# Patient Record
Sex: Male | Born: 1965 | Hispanic: No | Marital: Single | Smoking: Former smoker
Health system: Southern US, Community
[De-identification: ages and names within clinical notes are randomized; demographics above are authoritative.]

## PROBLEM LIST (undated history)

## (undated) DIAGNOSIS — J302 Other seasonal allergic rhinitis: Secondary | ICD-10-CM

## (undated) DIAGNOSIS — G2581 Restless legs syndrome: Secondary | ICD-10-CM

## (undated) DIAGNOSIS — G8929 Other chronic pain: Secondary | ICD-10-CM

## (undated) DIAGNOSIS — M549 Dorsalgia, unspecified: Secondary | ICD-10-CM

## (undated) HISTORY — DX: Restless legs syndrome: G25.81

## (undated) HISTORY — DX: Other seasonal allergic rhinitis: J30.2

## (undated) HISTORY — PX: APPENDECTOMY: SHX54

## (undated) HISTORY — PX: DENTAL SURGERY: SHX609

---

## 1998-04-23 ENCOUNTER — Ambulatory Visit (HOSPITAL_COMMUNITY): Admission: RE | Admit: 1998-04-23 | Discharge: 1998-04-23 | Payer: Self-pay | Admitting: Internal Medicine

## 1998-11-12 ENCOUNTER — Ambulatory Visit (HOSPITAL_COMMUNITY): Admission: RE | Admit: 1998-11-12 | Discharge: 1998-11-12 | Payer: Self-pay | Admitting: Internal Medicine

## 2010-07-15 ENCOUNTER — Encounter: Payer: Self-pay | Admitting: Orthopedic Surgery

## 2011-05-23 ENCOUNTER — Inpatient Hospital Stay (HOSPITAL_COMMUNITY)
Admission: EM | Admit: 2011-05-23 | Discharge: 2011-05-29 | DRG: 392 | Disposition: A | Discharge status: Home / Self Care | Payer: Managed Care, Other (non HMO) | Attending: Internal Medicine | Admitting: Internal Medicine

## 2011-05-23 ENCOUNTER — Encounter: Payer: Self-pay | Admitting: *Deleted

## 2011-05-23 DIAGNOSIS — M545 Low back pain, unspecified: Secondary | ICD-10-CM | POA: Diagnosis present

## 2011-05-23 DIAGNOSIS — R197 Diarrhea, unspecified: Secondary | ICD-10-CM | POA: Diagnosis present

## 2011-05-23 DIAGNOSIS — G2581 Restless legs syndrome: Secondary | ICD-10-CM | POA: Diagnosis present

## 2011-05-23 DIAGNOSIS — E871 Hypo-osmolality and hyponatremia: Secondary | ICD-10-CM | POA: Diagnosis present

## 2011-05-23 DIAGNOSIS — G8929 Other chronic pain: Secondary | ICD-10-CM | POA: Diagnosis present

## 2011-05-23 DIAGNOSIS — D72819 Decreased white blood cell count, unspecified: Secondary | ICD-10-CM | POA: Diagnosis present

## 2011-05-23 DIAGNOSIS — R110 Nausea: Secondary | ICD-10-CM | POA: Diagnosis not present

## 2011-05-23 DIAGNOSIS — A09 Infectious gastroenteritis and colitis, unspecified: Principal | ICD-10-CM | POA: Diagnosis present

## 2011-05-23 DIAGNOSIS — Z87891 Personal history of nicotine dependence: Secondary | ICD-10-CM

## 2011-05-23 DIAGNOSIS — E860 Dehydration: Secondary | ICD-10-CM | POA: Diagnosis present

## 2011-05-23 DIAGNOSIS — R21 Rash and other nonspecific skin eruption: Secondary | ICD-10-CM | POA: Diagnosis not present

## 2011-05-23 DIAGNOSIS — D6959 Other secondary thrombocytopenia: Secondary | ICD-10-CM | POA: Diagnosis present

## 2011-05-23 HISTORY — DX: Dorsalgia, unspecified: M54.9

## 2011-05-23 HISTORY — DX: Other chronic pain: G89.29

## 2011-05-23 LAB — DIFFERENTIAL
Basophils Relative: 0 % (ref 0–1)
Eosinophils Absolute: 0 10*3/uL (ref 0.0–0.7)
Lymphs Abs: 0.3 10*3/uL — ABNORMAL LOW (ref 0.7–4.0)
Monocytes Relative: 16 % — ABNORMAL HIGH (ref 3–12)
Neutro Abs: 1.3 10*3/uL — ABNORMAL LOW (ref 1.7–7.7)
Neutrophils Relative %: 70 % (ref 43–77)

## 2011-05-23 LAB — COMPREHENSIVE METABOLIC PANEL
ALT: 24 U/L (ref 0–53)
Albumin: 3.5 g/dL (ref 3.5–5.2)
Alkaline Phosphatase: 85 U/L (ref 39–117)
BUN: 9 mg/dL (ref 6–23)
Chloride: 90 mEq/L — ABNORMAL LOW (ref 96–112)
Glucose, Bld: 103 mg/dL — ABNORMAL HIGH (ref 70–99)
Potassium: 3.9 mEq/L (ref 3.5–5.1)
Sodium: 122 mEq/L — ABNORMAL LOW (ref 135–145)
Total Bilirubin: 0.4 mg/dL (ref 0.3–1.2)
Total Protein: 6.8 g/dL (ref 6.0–8.3)

## 2011-05-23 LAB — CBC
Hemoglobin: 12.8 g/dL — ABNORMAL LOW (ref 13.0–17.0)
MCH: 28.9 pg (ref 26.0–34.0)
Platelets: 163 10*3/uL (ref 150–400)
RBC: 4.43 MIL/uL (ref 4.22–5.81)

## 2011-05-23 MED ORDER — SODIUM CHLORIDE 0.9 % IV SOLN
20.0000 mL | INTRAVENOUS | Status: DC
  Administered 2011-05-23: 1000 mL via INTRAVENOUS
  Administered 2011-05-24: 05:00:00 via INTRAVENOUS

## 2011-05-23 NOTE — ED Provider Notes (Signed)
History     CSN: [id number] Arrival date & time: 05/23/2011 10:05 PM   First MD Initiated Contact with Patient 05/23/11 2302      Chief Complaint  Patient presents with  . Diarrhea    x2 days, [place] x 1 week ago, seen by PCP  . Fever  . Dizziness    hyponatremia  . Nausea     Patient is a 45 y.o. male presenting with diarrhea and fever.  Diarrhea  Fever Primary symptoms do not include shortness of breath.   This 63 old male presents with fever diarrhea, generalized fatigue. He notes returning from [place] 3 days ago. After a brief period of being well, he gradually developed his symptoms. Since onset he has felt weak, nauseous, anorexic, with diarrhea (6 times yesterday, multiple times today). Fever with maximum temperature 102, improves with Tylenol, but recurs. The patient has seen his physician twice in the illness. The patient's sodium level was abnormally low today, and he was referred to the ED for further evaluation.  Following this second presentation to his primary care physician, he was started on ciprofloxacin. The patient denies significant abdominal pain, chest pain, dyspnea, confusion, dysuria, ataxia, lower extremity edema.  Past Medical History  Diagnosis Date  . Chronic back pain greater than 3 months duration   . Asthma     Past Surgical History  Procedure Date  . Appendectomy   . Dental surgery     History reviewed. No pertinent family history.  History  Substance Use Topics  . Smoking status: Former Games developer  . Smokeless tobacco: Not on file  . Alcohol Use: Yes     occasionally      Review of Systems  HENT: Negative.   Eyes: Negative.   Respiratory: Negative for shortness of breath.   Cardiovascular: Negative.   Genitourinary: Negative.   Skin: Negative.   Neurological: Positive for light-headedness.  Hematological: Negative.   Psychiatric/Behavioral: Negative.     Allergies  Review of patient's allergies indicates no known  allergies.  Home Medications   Current Outpatient Rx  Name Route Sig Dispense Refill  . ACETAMINOPHEN 500 MG PO TABS Oral Take 500-1,000 mg by mouth every 6 (six) hours as needed. For pain.     . ALBUTEROL SULFATE HFA 108 (90 BASE) MCG/ACT IN AERS Inhalation Inhale 2 puffs into the lungs every 6 (six) hours as needed.      Marland Kitchen BISMUTH SUBSALICYLATE 262 MG PO CHEW Oral Chew 262 mg by mouth 3 (three) times daily as needed. For indigestion.     Marland Kitchen CIPROFLOXACIN HCL 500 MG PO TABS Oral Take 500 mg by mouth 2 (two) times daily. 3 day course of therapy, not completed.     . ETODOLAC 500 MG PO TABS Oral Take 500 mg by mouth as needed.      Marland Kitchen FLUTICASONE-SALMETEROL 100-50 MCG/DOSE IN AEPB Inhalation Inhale 1 puff into the lungs as needed.      Marland Kitchen GLUCOSAMINE HCL PO Oral Take 1 capsule by mouth daily.      Carma Leaven M PLUS PO TABS Oral Take 1 tablet by mouth daily.      Marland Kitchen ROPINIROLE HCL 0.25 MG PO TABS Oral Take 0.25 mg by mouth at bedtime.      Marland Kitchen VITAMIN C 250 MG PO TABS Oral Take 250 mg by mouth daily.        BP 100/59  Pulse 66  SpO2 100%  Physical Exam  Constitutional: He is oriented to person,  place, and time. He appears well-developed and well-nourished.  HENT:  Head: Normocephalic and atraumatic.  Eyes: Conjunctivae are normal. Pupils are equal, round, and reactive to light.  Neck: Neck supple.  Cardiovascular: Normal rate and regular rhythm.   Pulmonary/Chest: No respiratory distress.  Abdominal: Soft. There is no tenderness.  Musculoskeletal: He exhibits no edema.  Neurological: He is alert and oriented to person, place, and time.  Skin: Skin is warm and dry.  Psychiatric: He has a normal mood and affect.    ED Course  Procedures (including critical care time)   Labs Reviewed  CBC  DIFFERENTIAL  COMPREHENSIVE METABOLIC PANEL   No results found.   No diagnosis found.   45 year old male with days of diarrhea, nausea, fever following international travel. Likely traveler's  diarrhea, rule out significant lateral abnormalities. MDM  This previously well male presenting now for listlessness, diarrhea, nausea and fever is found to be not in distress on initial exam.  The patient's labs are notable for hyponatremia, evidence of dehydration consistent with persistent diarrhea. Given the patient's lack of abnormalities, will be admitted for rehydration, continued evaluation and management.        Gerhard Munch, MD 05/24/11 [id number]

## 2011-05-23 NOTE — ED Notes (Signed)
Pt c/o unrelenting diarrhea, has been seen by PCP, dx'd w/ hyponatremia since Wed. Sodium continues to decrease.

## 2011-05-24 ENCOUNTER — Encounter (HOSPITAL_COMMUNITY): Payer: Self-pay | Admitting: Internal Medicine

## 2011-05-24 DIAGNOSIS — R197 Diarrhea, unspecified: Secondary | ICD-10-CM | POA: Diagnosis present

## 2011-05-24 DIAGNOSIS — E860 Dehydration: Secondary | ICD-10-CM | POA: Diagnosis present

## 2011-05-24 DIAGNOSIS — E871 Hypo-osmolality and hyponatremia: Secondary | ICD-10-CM | POA: Diagnosis present

## 2011-05-24 LAB — CLOSTRIDIUM DIFFICILE BY PCR: Toxigenic C. Difficile by PCR: NEGATIVE

## 2011-05-24 LAB — BASIC METABOLIC PANEL
CO2: 27 mEq/L (ref 19–32)
Calcium: 7.8 mg/dL — ABNORMAL LOW (ref 8.4–10.5)
Chloride: 97 mEq/L (ref 96–112)
GFR calc Af Amer: 79 mL/min — ABNORMAL LOW (ref >90)
Sodium: 129 mEq/L — ABNORMAL LOW (ref 135–145)

## 2011-05-24 MED ORDER — FLUTICASONE-SALMETEROL 100-50 MCG/DOSE IN AEPB
1.0000 | INHALATION_SPRAY | Freq: Two times a day (BID) | RESPIRATORY_TRACT | Status: DC
  Filled 2011-05-24 (×2): qty 14

## 2011-05-24 MED ORDER — ROPINIROLE HCL 0.25 MG PO TABS
0.2500 mg | ORAL_TABLET | Freq: Every day | ORAL | Status: DC
  Administered 2011-05-26 (×2): 0.25 mg via ORAL
  Filled 2011-05-24 (×6): qty 1

## 2011-05-24 MED ORDER — THERA M PLUS PO TABS
1.0000 | ORAL_TABLET | Freq: Every day | ORAL | Status: DC
  Administered 2011-05-24 – 2011-05-29 (×6): 1 via ORAL
  Filled 2011-05-24 (×6): qty 1

## 2011-05-24 MED ORDER — SODIUM CHLORIDE 0.9 % IV SOLN
INTRAVENOUS | Status: AC
  Administered 2011-05-24 (×2): via INTRAVENOUS

## 2011-05-24 MED ORDER — CIPROFLOXACIN HCL 500 MG PO TABS
500.0000 mg | ORAL_TABLET | Freq: Two times a day (BID) | ORAL | Status: DC
  Filled 2011-05-24 (×4): qty 1

## 2011-05-24 MED ORDER — INFLUENZA VIRUS VACC SPLIT PF IM SUSP
0.5000 mL | INTRAMUSCULAR | Status: AC
  Filled 2011-05-24: qty 0.5

## 2011-05-24 MED ORDER — SODIUM CHLORIDE 0.9 % IV SOLN
INTRAVENOUS | Status: DC
  Administered 2011-05-24 – 2011-05-29 (×7): via INTRAVENOUS

## 2011-05-24 MED ORDER — ALBUTEROL SULFATE HFA 108 (90 BASE) MCG/ACT IN AERS: 2.0000 | INHALATION_SPRAY | RESPIRATORY_TRACT | Status: DC | PRN

## 2011-05-24 MED ORDER — ACETAMINOPHEN 500 MG PO TABS
500.0000 mg | ORAL_TABLET | ORAL | Status: DC | PRN
  Administered 2011-05-25: 1000 mg via ORAL
  Filled 2011-05-24: qty 2

## 2011-05-24 MED ORDER — KCL IN DEXTROSE-NACL 40-5-0.9 MEQ/L-%-% IV SOLN
INTRAVENOUS | Status: DC
  Administered 2011-05-24: 08:00:00 via INTRAVENOUS
  Filled 2011-05-24 (×10): qty 1000

## 2011-05-24 NOTE — ED Notes (Signed)
Patient denies pain and is resting comfortably.  

## 2011-05-24 NOTE — Progress Notes (Signed)
I have seen interviewed and examined patient admitted with diarrhea following a trip to [place], and found to be hyponatremic in the ED with borderline blood pressures. He is alert and mentating well, , and no further diarrhea so far this morning.We'll continue current management and plan as per Dr. Houston Siren, follow recheck of sodium and further manage as appropriate.

## 2011-05-24 NOTE — ED Notes (Signed)
Pt's wife:  Meshach Perry  Cell= [phone] [phone]

## 2011-05-24 NOTE — ED Notes (Signed)
Patient is resting comfortably. 

## 2011-05-24 NOTE — H&P (Signed)
PCP:   No primary provider on file.   Chief Complaint: Persistent diarrhea, hyponatremia.   HPI: [name] Ward is an 45 y.o. male with history of chronic lower back pain, restless leg syndrome, allergies, refer to the emergency room from his primary care physician for hyponatremia and persistent watery diarrhea. He has came back to the [place] from a trip to [place]. He was well and [place], but 2 days upon returning to the [place], he developed watery diarrhea. He denied any bloody stool abdominal pain, fever, chills, nausea, vomiting, lightheadedness, or any other symptomology. At his primary care physician, he was found to have a serum sodium of 118. He was subsequently referred here for dehydration, hyponatremia, and persistent diarrhea. No one in his family was ill. He did not eat any exotic food except for some curry. He has not been on antibiotics recently. He was just put on Cipro for his diarrhea.  Rewiew of Systems:  The patient denies anorexia, fever, weight loss,, vision loss, decreased hearing, hoarseness, chest pain, syncope, dyspnea on exertion, peripheral edema, balance deficits, hemoptysis, abdominal pain, melena, hematochezia, severe indigestion/heartburn, hematuria, incontinence, genital sores, muscle weakness, suspicious skin lesions, transient blindness, difficulty walking, depression, unusual weight change, abnormal bleeding, enlarged lymph nodes, angioedema, and breast masses.   Past Medical History  Diagnosis Date  . Chronic back pain greater than 3 months duration   . Asthma     Past Surgical History  Procedure Date  . Appendectomy   . Dental surgery     Medications:  HOME MEDS: Prior to Admission medications   Medication Sig Start Date End Date Taking? Authorizing Provider  acetaminophen (TYLENOL) 500 MG tablet Take 500-1,000 mg by mouth every 6 (six) hours as needed. For pain.    Yes Historical Provider, MD  albuterol (PROVENTIL HFA;VENTOLIN HFA) 108 (90 BASE)  MCG/ACT inhaler Inhale 2 puffs into the lungs every 6 (six) hours as needed.     Yes Historical Provider, MD  bismuth subsalicylate (PEPTO BISMOL) 262 MG chewable tablet Chew 262 mg by mouth 3 (three) times daily as needed. For indigestion.    Yes Historical Provider, MD  ciprofloxacin (CIPRO) 500 MG tablet Take 500 mg by mouth 2 (two) times daily. 3 day course of therapy, not completed.    Yes Historical Provider, MD  etodolac (LODINE) 500 MG tablet Take 500 mg by mouth as needed.     Yes Historical Provider, MD  Fluticasone-Salmeterol (ADVAIR) 100-50 MCG/DOSE AEPB Inhale 1 puff into the lungs as needed.     Yes Historical Provider, MD  GLUCOSAMINE HCL PO Take 1 capsule by mouth daily.     Yes Historical Provider, MD  Multiple Vitamins-Minerals (MULTIVITAMINS THER. W/MINERALS) TABS Take 1 tablet by mouth daily.     Yes Historical Provider, MD  rOPINIRole (REQUIP) 0.25 MG tablet Take 0.25 mg by mouth at bedtime.     Yes Historical Provider, MD  vitamin C (ASCORBIC ACID) 250 MG tablet Take 250 mg by mouth daily.     Yes Historical Provider, MD     Allergies:  No Known Allergies  Social History:   reports that he has quit smoking. He does not have any smokeless tobacco history on file. He reports that he drinks alcohol. He reports that he does not use illicit drugs.  Family History: History reviewed. No pertinent family history.   Physical Exam: Filed Vitals:   05/24/11 0030 05/24/11 0045 05/24/11 0100 05/24/11 0145  BP:   117/63 108/55  Pulse: 60  65    SpO2: 100% 100%     Blood pressure 108/55, pulse 65, SpO2 100.00%.  GEN:  Pleasant person lying in the stretcher in no acute distress; cooperative with exam PSYCH:  alert and oriented x4; does not appear anxious does not appear depressed; affect is normal HEENT: Mucous membranes pink and anicteric; PERRLA; EOM intact; no cervical lymphadenopathy nor thyromegaly or carotid bruit; no JVD; Breasts:: Not examined CHEST WALL: No  tenderness CHEST: Normal respiration, clear to auscultation bilaterally HEART: Regular rate and rhythm; no murmurs rubs or gallops BACK: No kyphosis or scoliosis; no CVA tenderness ABDOMEN: Obese, soft non-tender; no masses, no organomegaly, normal abdominal bowel sounds; no pannus; no intertriginous candida. Rectal Exam: Not done EXTREMITIES: No bone or joint deformity; age-appropriate arthropathy of the hands and knees; no edema; no ulcerations. Genitalia: not examined PULSES: 2+ and symmetric SKIN: Normal hydration no rash or ulceration. He does have poor skin turgor. CNS: Cranial nerves 2-12 grossly intact no focal neurologic deficit   Labs & Imaging Results for orders placed during the hospital encounter of 05/23/11 (from the past 48 hour(s))  CBC     Status: Abnormal   Collection Time   05/23/11 10:43 PM      Component Value Range Comment   WBC 1.9 (*) 4.0 - 10.5 (K/uL)    RBC 4.43  4.22 - 5.81 (MIL/uL)    Hemoglobin 12.8 (*) 13.0 - 17.0 (g/dL)    HCT 21.3 (*) 08.6 - 52.0 (%)    MCV 81.3  78.0 - 100.0 (fL)    MCH 28.9  26.0 - 34.0 (pg)    MCHC 35.6  30.0 - 36.0 (g/dL)    RDW 57.8  46.9 - 62.9 (%)    Platelets 163  150 - 400 (K/uL)   DIFFERENTIAL     Status: Abnormal   Collection Time   05/23/11 10:43 PM      Component Value Range Comment   Neutrophils Relative 70  43 - 77 (%)    Neutro Abs 1.3 (*) 1.7 - 7.7 (K/uL)    Lymphocytes Relative 15  12 - 46 (%)    Lymphs Abs 0.3 (*) 0.7 - 4.0 (K/uL)    Monocytes Relative 16 (*) 3 - 12 (%)    Monocytes Absolute 0.3  0.1 - 1.0 (K/uL)    Eosinophils Relative 0  0 - 5 (%)    Eosinophils Absolute 0.0  0.0 - 0.7 (K/uL)    Basophils Relative 0  0 - 1 (%)    Basophils Absolute 0.0  0.0 - 0.1 (K/uL)   COMPREHENSIVE METABOLIC PANEL     Status: Abnormal   Collection Time   05/23/11 10:43 PM      Component Value Range Comment   Sodium 122 (*) 135 - 145 (mEq/L)    Potassium 3.9  3.5 - 5.1 (mEq/L)    Chloride 90 (*) 96 - 112 (mEq/L)     CO2 25  19 - 32 (mEq/L)    Glucose, Bld 103 (*) 70 - 99 (mg/dL)    BUN 9  6 - 23 (mg/dL)    Creatinine, Ser 5.28  0.50 - 1.35 (mg/dL)    Calcium 8.2 (*) 8.4 - 10.5 (mg/dL)    Total Protein 6.8  6.0 - 8.3 (g/dL)    Albumin 3.5  3.5 - 5.2 (g/dL)    AST 44 (*) 0 - 37 (U/L)    ALT 24  0 - 53 (U/L)    Alkaline Phosphatase 85  39 - 117 (U/L)    Total Bilirubin 0.4  0.3 - 1.2 (mg/dL)    GFR calc non Af Amer 77 (*) >90 (mL/min)    GFR calc Af Amer 90 (*) >90 (mL/min)    No results found.    Assessment Present on Admission:  .Diarrhea .Hyponatremia .Dehydration Leukopenia  PLAN:  I suspect that he has infectious diarrhea. He is very volume depleted and having hyponatremia. Is not on any diuretic. Also noted that he has leukopenia. Will continue his Cipro by mouth. He would need fluid resuscitation with normal saline. We'll follow his serum sodium closely. We'll add potassium to his IV fluid. I will send stool for routine culture along with C. difficile PCR. Will also repeat a CBC tomorrow. He is stable and will be admitted to [facility].   Other plans as per orders.    Amit Leece 05/24/2011, 3:00 AM

## 2011-05-25 DIAGNOSIS — E871 Hypo-osmolality and hyponatremia: Secondary | ICD-10-CM

## 2011-05-25 LAB — CBC
HCT: 35.7 % — ABNORMAL LOW (ref 39.0–52.0)
MCH: 29.2 pg (ref 26.0–34.0)
MCHC: 35.3 g/dL (ref 30.0–36.0)
RDW: 12.6 % (ref 11.5–15.5)

## 2011-05-25 LAB — BASIC METABOLIC PANEL
BUN: 7 mg/dL (ref 6–23)
Calcium: 8.6 mg/dL (ref 8.4–10.5)
Creatinine, Ser: 1.09 mg/dL (ref 0.50–1.35)
GFR calc Af Amer: >90 mL/min (ref >90)
GFR calc non Af Amer: 79 mL/min — ABNORMAL LOW (ref >90)
Potassium: 4.2 mEq/L (ref 3.5–5.1)

## 2011-05-25 MED ORDER — CIPROFLOXACIN HCL 500 MG PO TABS
500.0000 mg | ORAL_TABLET | Freq: Two times a day (BID) | ORAL | Status: DC
  Administered 2011-05-25 – 2011-05-29 (×10): 500 mg via ORAL
  Filled 2011-05-25 (×12): qty 1

## 2011-05-25 MED ORDER — SIMETHICONE 80 MG PO CHEW
80.0000 mg | CHEWABLE_TABLET | Freq: Four times a day (QID) | ORAL | Status: DC | PRN
  Administered 2011-05-25: 80 mg via ORAL
  Filled 2011-05-25 (×2): qty 1

## 2011-05-25 NOTE — Progress Notes (Addendum)
Date of Admission:  05/23/2011  Date of Consult:  05/25/2011  Reason for Consult:fever after travel Referring Physician: Donna Bernard  Impression/Recommendation Fever Diarrhea Hyponatremia Leukopenia Would- check his serologies for Ameobiasis, giardiasis, cryptosporidiosos, HIV. Also check for noro and rota-viruses. If does not feel improved, has more fevers, loose BM, over the next 24-48 hours, would consider imaging his abdomen for occult abscess, and could consider sigmoidoscopy. He appears to be improving- he has been afebrile today. I explained to him that his decreased WBC could be a reaction to the systemic infection that he had.    Ward [name] is an 45 y.o. male.  HPI: 45 year old male with a history of asthma who returned on 05/18/2000 after spending one week in [place]. He felt well for the next 2 days however on the morning of the 28th he developed fever and stayed home from work. While at home, he had a presyncopal episode followed by fever and chills. He saw his physician that day and reportedly had a normal EKG but a sodium of 129. On November 29 he had 6 leg loose bowel movements stating that everything he ate essentially came out "raw". On November 30 he was seen by his physician again and was given a prescription for ciprofloxacin. He had blood work done showing a sodium of 119 and he was advised to come to the hospital. He had 3 loose bowel movements on November 30. Since coming to the hospital, he has had decreased loose bowel movements. he had one bowel movement on December 1 which he described as dark and slimy.  Past Medical History  Diagnosis Date  . Chronic back pain greater than 3 months duration   . Asthma     Past Surgical History  Procedure Date  . Appendectomy   . Dental surgery   ergies:   No Known Allergies  Medications: I have reviewed the patient's current medications.  Social History:  reports that he has quit smoking. He does not have any smokeless tobacco  history on file. He reports that he drinks alcohol. He reports that he does not use illicit drugs.  History reviewed. No pertinent family history.  ROS- please see history of present illness. He has had dizziness. He is a propelled weight loss during this period. He has had normal urination. He's had no headaches. He has had no oral ulcers. His major complaint is weakness. No lymphadenopathy.   Blood pressure 108/55, pulse 50, temperature 98.5 F (36.9 C), temperature source Oral, resp. rate 18, height 5\' 7"  (1.702 m), weight 69.2 kg (152 lb 8.9 oz), SpO2 100.00%. Physical exam: Eyes- EOMI, PERRL mouth shows no ulcers or thrush.  His neck is nontender, no lymphadenopathy. Chest is clear auscultation CV regular rhythm   Abdomen- bowel sounds positive, soft, nontender.  extremities show no cyanosis clubbing or edema. neurologic exam is grossly normal. Lymphatic- there is no cervical, supraclavicular or axillary adenopathy.   Results for orders placed during the hospital encounter of 05/23/11 (from the past 48 hour(s))  CBC     Status: Abnormal   Collection Time   05/23/11 10:43 PM      Component Value Range Comment   WBC 1.9 (*) 4.0 - 10.5 (K/uL)    RBC 4.43  4.22 - 5.81 (MIL/uL)    Hemoglobin 12.8 (*) 13.0 - 17.0 (g/dL)    HCT 16.1 (*) 09.6 - 52.0 (%)    MCV 81.3  78.0 - 100.0 (fL)    MCH 28.9  26.0 - 34.0 (  pg)    MCHC 35.6  30.0 - 36.0 (g/dL)    RDW 21.3  08.6 - 57.8 (%)    Platelets 163  150 - 400 (K/uL)   DIFFERENTIAL     Status: Abnormal   Collection Time   05/23/11 10:43 PM      Component Value Range Comment   Neutrophils Relative 70  43 - 77 (%)    Neutro Abs 1.3 (*) 1.7 - 7.7 (K/uL)    Lymphocytes Relative 15  12 - 46 (%)    Lymphs Abs 0.3 (*) 0.7 - 4.0 (K/uL)    Monocytes Relative 16 (*) 3 - 12 (%)    Monocytes Absolute 0.3  0.1 - 1.0 (K/uL)    Eosinophils Relative 0  0 - 5 (%)    Eosinophils Absolute 0.0  0.0 - 0.7 (K/uL)    Basophils Relative 0  0 - 1 (%)     Basophils Absolute 0.0  0.0 - 0.1 (K/uL)   COMPREHENSIVE METABOLIC PANEL     Status: Abnormal   Collection Time   05/23/11 10:43 PM      Component Value Range Comment   Sodium 122 (*) 135 - 145 (mEq/L)    Potassium 3.9  3.5 - 5.1 (mEq/L)    Chloride 90 (*) 96 - 112 (mEq/L)    CO2 25  19 - 32 (mEq/L)    Glucose, Bld 103 (*) 70 - 99 (mg/dL)    BUN 9  6 - 23 (mg/dL)    Creatinine, Ser 4.69  0.50 - 1.35 (mg/dL)    Calcium 8.2 (*) 8.4 - 10.5 (mg/dL)    Total Protein 6.8  6.0 - 8.3 (g/dL)    Albumin 3.5  3.5 - 5.2 (g/dL)    AST 44 (*) 0 - 37 (U/L)    ALT 24  0 - 53 (U/L)    Alkaline Phosphatase 85  39 - 117 (U/L)    Total Bilirubin 0.4  0.3 - 1.2 (mg/dL)    GFR calc non Af Amer 77 (*) >90 (mL/min)    GFR calc Af Amer 90 (*) >90 (mL/min)   BASIC METABOLIC PANEL     Status: Abnormal   Collection Time   05/24/11  2:10 PM      Component Value Range Comment   Sodium 129 (*) 135 - 145 (mEq/L)    Potassium 3.9  3.5 - 5.1 (mEq/L)    Chloride 97  96 - 112 (mEq/L)    CO2 27  19 - 32 (mEq/L)    Glucose, Bld 107 (*) 70 - 99 (mg/dL)    BUN 7  6 - 23 (mg/dL)    Creatinine, Ser 6.29  0.50 - 1.35 (mg/dL)    Calcium 7.8 (*) 8.4 - 10.5 (mg/dL)    GFR calc non Af Amer 68 (*) >90 (mL/min)    GFR calc Af Amer 79 (*) >90 (mL/min)   CLOSTRIDIUM DIFFICILE BY PCR     Status: Normal   Collection Time   05/24/11  3:07 PM      Component Value Range Comment   C difficile by pcr NEGATIVE  NEGATIVE    CBC     Status: Abnormal   Collection Time   05/25/11  6:17 AM      Component Value Range Comment   WBC 1.3 (*) 4.0 - 10.5 (K/uL)    RBC 4.32  4.22 - 5.81 (MIL/uL)    Hemoglobin 12.6 (*) 13.0 - 17.0 (g/dL)    HCT  35.7 (*) 39.0 - 52.0 (%)    MCV 82.6  78.0 - 100.0 (fL)    MCH 29.2  26.0 - 34.0 (pg)    MCHC 35.3  30.0 - 36.0 (g/dL)    RDW 91.4  78.2 - 95.6 (%)    Platelets 130 (*) 150 - 400 (K/uL)   BASIC METABOLIC PANEL     Status: Abnormal   Collection Time   05/25/11  6:17 AM      Component Value Range  Comment   Sodium 128 (*) 135 - 145 (mEq/L)    Potassium 4.2  3.5 - 5.1 (mEq/L)    Chloride 95 (*) 96 - 112 (mEq/L)    CO2 27  19 - 32 (mEq/L)    Glucose, Bld 103 (*) 70 - 99 (mg/dL)    BUN 7  6 - 23 (mg/dL)    Creatinine, Ser 2.13  0.50 - 1.35 (mg/dL)    Calcium 8.6  8.4 - 10.5 (mg/dL)    GFR calc non Af Amer 79 (*) >90 (mL/min)    GFR calc Af Amer >90  >90 (mL/min)    No results found for this basename: sdes, specrequest, cult, reptstatus   No results found.  Thank you so much for this interesting consult,   [name] Ward 05/25/2011, 5:28 PM

## 2011-05-25 NOTE — Progress Notes (Signed)
Pt would like Dr Ninetta Lights to consider a change or addition to his antibiotic. Pt reports no loose stools today but does report dark stool. Inquiring about Heme occult check. Pt does report taking Pepto Bismol for diarrhea pre admission. Appetite increasing and tolerating diet. No pain reported. Does complain of cold chills a few times today. Afebrile.

## 2011-05-25 NOTE — Progress Notes (Signed)
Subjective: States no further diarrhea today are, denies nausea vomiting but reports a decreased appetite.. Objective: Vital signs in last 24 hours: Temp:  [98.2 F (36.8 C)-98.5 F (36.9 C)] 98.5 F (36.9 C) (12/02 1418) Pulse Rate:  [50-60] 50  (12/02 1418) Resp:  [18] 18  (12/02 1418) BP: (98-124)/(53-73) 108/55 mmHg (12/02 1418) SpO2:  [100 %] 100 % (12/02 1418) Weight:  [69.2 kg (152 lb 8.9 oz)] 152 lb 8.9 oz (69.2 kg) (12/02 0723) Last BM Date: 05/24/11 Intake/Output from previous day: 12/01 0701 - 12/02 0700 In: 360 [P.O.:360] Out: -  Intake/Output this shift:      General Appearance:    Alert, cooperative, no distress, appears stated age  Lungs:     Clear to auscultation bilaterally, respirations unlabored   Heart:    Regular rate and rhythm, S1 and S2 normal, no murmur, rub   or gallop  Abdomen:     Soft, non-tender, bowel sounds active all four quadrants,    no masses, no organomegaly  Extremities:   Extremities normal, atraumatic, no cyanosis or edema  Neurologic:   CNII-XII intact, normal strength, sensation and reflexes    throughout    Weight change:  No intake or output data in the 24 hours ending 05/25/11 1457  Lab Results:   [facility] 05/25/11 0617 05/24/11 1410  NA 128* 129*  K 4.2 3.9  CL 95* 97  CO2 27 27  GLUCOSE 103* 107*  BUN 7 7  CREATININE 1.09 1.23  CALCIUM 8.6 7.8*    Basename 05/25/11 0617 05/23/11 2243  WBC 1.3* 1.9*  HGB 12.6* 12.8*  HCT 35.7* 36.0*  PLT 130* 163  MCV 82.6 81.3   PT/INR No results found for this basename: LABPROT:2,INR:2 in the last 72 hours ABG No results found for this basename: PHART:2,PCO2:2,PO2:2,HCO3:2 in the last 72 hours  Micro Results: Recent Results (from the past 240 hour(s))  CLOSTRIDIUM DIFFICILE BY PCR     Status: Normal   Collection Time   05/24/11  3:07 PM      Component Value Range Status Comment   C difficile by pcr NEGATIVE  NEGATIVE  Final    Studies/Results: No results  found. Medications:  Scheduled Meds:   . sodium chloride   Intravenous STAT  . ciprofloxacin  500 mg Oral BID  . Fluticasone-Salmeterol  1 puff Inhalation BID  . influenza  inactive virus vaccine  0.5 mL Intramuscular Tomorrow-1000  . multivitamins ther. w/minerals  1 tablet Oral Daily  . rOPINIRole  0.25 mg Oral QHS  . DISCONTD: ciprofloxacin  500 mg Oral BID   Continuous Infusions:   . sodium chloride 150 mL/hr at 05/25/11 0751  . dextrose 5 % and 0.9 % NaCl with KCl 40 mEq/L 125 mL/hr at 05/24/11 0758   PRN Meds:.acetaminophen, albuterol Assessment/Plan: Patient Active Hospital Problem List: Diarrhea - presumed infectious, C. difficile negative. Patient still febrile on Cipro, and leukopenic-I have consulted infectious disease, Dr. Ninetta Lights to see patient for further evaluation/recommendation.   Hyponatremia/Dehydration- improving with hydration, follow and recheck  Leukopenia- secondary to infection, follow recheck continue antibiotics as above pending ID recommendations.   LOS: 2 days   Jaqueline Uber C 05/25/2011, 2:57 PM

## 2011-05-26 DIAGNOSIS — R197 Diarrhea, unspecified: Secondary | ICD-10-CM

## 2011-05-26 LAB — COMPREHENSIVE METABOLIC PANEL
ALT: 66 U/L — ABNORMAL HIGH (ref 0–53)
AST: 94 U/L — ABNORMAL HIGH (ref 0–37)
Albumin: 3.9 g/dL (ref 3.5–5.2)
Alkaline Phosphatase: 89 U/L (ref 39–117)
Calcium: 9.4 mg/dL (ref 8.4–10.5)
Potassium: 4.1 mEq/L (ref 3.5–5.1)
Sodium: 129 mEq/L — ABNORMAL LOW (ref 135–145)
Total Protein: 7.5 g/dL (ref 6.0–8.3)

## 2011-05-26 LAB — CBC
Hemoglobin: 13.8 g/dL (ref 13.0–17.0)
MCH: 28.4 pg (ref 26.0–34.0)
MCHC: 34.8 g/dL (ref 30.0–36.0)
Platelets: 141 10*3/uL — ABNORMAL LOW (ref 150–400)
RDW: 12.3 % (ref 11.5–15.5)

## 2011-05-26 LAB — HIV ANTIBODY (ROUTINE TESTING W REFLEX): HIV: NONREACTIVE

## 2011-05-26 MED ORDER — INFLUENZA VIRUS VACC SPLIT PF IM SUSP
0.5000 mL | INTRAMUSCULAR | Status: AC
  Filled 2011-05-26: qty 0.5

## 2011-05-26 NOTE — Progress Notes (Signed)
Patient ID: [name] Ward, male   DOB: 1965-07-27, 45 y.o.   MRN: [id number] INFECTIOUS DISEASE PROGRESS NOTE   Subjective: [name] Ward is feeling better. His diarrhea has resolved. He had one semi-formed stool this morning. He is still having some problem with nausea but is tolerating liquids well without vomiting.  Day 3 total antibiotics Anti-infectives     Start     Dose/Rate Route Frequency Ordered Stop   05/25/11 0200   ciprofloxacin (CIPRO) tablet 500 mg        500 mg Oral 2 times daily 05/25/11 0022     05/24/11 1530   ciprofloxacin (CIPRO) tablet 500 mg  Status:  Discontinued        500 mg Oral 2 times daily 05/24/11 1418 05/25/11 0022          Objective Temp (24hrs), Avg:98.9 F (37.2 C), Min:98.2 F (36.8 C), Max:99.6 F (37.6 C)   He is in no distress and looks well. He has no rash and no palpable adenopathy. Lungs: Clear Cor: Regular S1 and S2 and no murmurs Abd: Soft and nontender, with quiet bowel sounds   Lab Results   Lab Results  Component Value Date   WBC 1.6* 05/26/2011   HGB 13.8 05/26/2011   HCT 39.7 05/26/2011   MCV 81.7 05/26/2011   PLT 141* 05/26/2011    Lab Results  Component Value Date   CREATININE 1.09 05/26/2011   BUN 6 05/26/2011   NA 129* 05/26/2011   K 4.1 05/26/2011   CL 91* 05/26/2011   CO2 29 05/26/2011    Lab Results  Component Value Date   ALT 66* 05/26/2011   AST 94* 05/26/2011   ALKPHOS 89 05/26/2011   BILITOT 0.4 05/26/2011     Microbiology: Recent Results (from the past 240 hour(s))  STOOL CULTURE     Status: Normal (Preliminary result)   Collection Time   05/24/11  9:09 AM      Component Value Range Status Comment   Specimen Description PERIRECTAL   Final    Special Requests NONE   Final    Culture NO SUSPICIOUS COLONIES, CONTINUING TO HOLD   Final    Report Status PENDING   Incomplete   CLOSTRIDIUM DIFFICILE BY PCR     Status: Normal   Collection Time   05/24/11  3:07 PM      Component Value Range Status Comment   C  difficile by pcr NEGATIVE  NEGATIVE  Final     Studies/Results: No results found.   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  Plan: 1. Continue ciprofloxacin pending final stool studies and serologies. If he continues to improve overnight he can probably be discharged home tomorrow to complete a seven-day course of antibiotic therapy.  [name] Ward [facility] [phone] 05/26/2011, 5:16 PM

## 2011-05-26 NOTE — Progress Notes (Signed)
Subjective: States no further diarrhea today are, state he had a mostly formed stool x1 greenish today. Still with poor appetite but eating and no vomiting. Objective: Vital signs in last 24 hours: Temp:  [98.2 F (36.8 C)-99.6 F (37.6 C)] 98.3 F (36.8 C) (12/03 1429) Pulse Rate:  [67-77] 74  (12/03 1429) Resp:  [18] 18  (12/03 1429) BP: (110-123)/(69-80) 116/80 mmHg (12/03 1429) SpO2:  [98 %-100 %] 100 % (12/03 1429) Weight:  [64.4 kg (141 lb 15.6 oz)] 141 lb 15.6 oz (64.4 kg) (12/03 0526) Last BM Date: 05/25/11 Intake/Output from previous day:   Intake/Output this shift: Total I/O In: 360 [P.O.:360] Out: -     General Appearance:    Alert, cooperative, no distress, appears stated age  Lungs:     Clear to auscultation bilaterally, respirations unlabored   Heart:    Regular rate and rhythm, S1 and S2 normal, no murmur, rub   or gallop  Abdomen:     Soft, non-tender, bowel sounds active all four quadrants,    no masses, no organomegaly  Extremities:   Extremities normal, atraumatic, no cyanosis or edema  Neurologic:   CNII-XII intact, normal strength,        Weight change: -1.108 kg (-2 lb 7.1 oz)  Intake/Output Summary (Last 24 hours) at 05/26/11 1649 Last data filed at 05/26/11 0757  Gross per 24 hour  Intake    360 ml  Output      0 ml  Net    360 ml    Lab Results:   [facility] 05/26/11 0515 05/25/11 0617  NA 129* 128*  K 4.1 4.2  CL 91* 95*  CO2 29 27  GLUCOSE 95 103*  BUN 6 7  CREATININE 1.09 1.09  CALCIUM 9.4 8.6    Basename 05/26/11 0515 05/25/11 0617  WBC 1.6* 1.3*  HGB 13.8 12.6*  HCT 39.7 35.7*  PLT 141* 130*  MCV 81.7 82.6   PT/INR No results found for this basename: LABPROT:2,INR:2 in the last 72 hours ABG No results found for this basename: PHART:2,PCO2:2,PO2:2,HCO3:2 in the last 72 hours  Micro Results: Recent Results (from the past 240 hour(s))  STOOL CULTURE     Status: Normal (Preliminary result)   Collection Time   05/24/11   9:09 AM      Component Value Range Status Comment   Specimen Description PERIRECTAL   Final    Special Requests NONE   Final    Culture NO SUSPICIOUS COLONIES, CONTINUING TO HOLD   Final    Report Status PENDING   Incomplete   CLOSTRIDIUM DIFFICILE BY PCR     Status: Normal   Collection Time   05/24/11  3:07 PM      Component Value Range Status Comment   C difficile by pcr NEGATIVE  NEGATIVE  Final    Studies/Results: No results found. Medications:  Scheduled Meds:    . ciprofloxacin  500 mg Oral BID  . Fluticasone-Salmeterol  1 puff Inhalation BID  . influenza  inactive virus vaccine  0.5 mL Intramuscular Tomorrow-1000  . influenza  inactive virus vaccine  0.5 mL Intramuscular Tomorrow-1000  . multivitamins ther. w/minerals  1 tablet Oral Daily  . rOPINIRole  0.25 mg Oral QHS   Continuous Infusions:    . sodium chloride 150 mL/hr at 05/26/11 1349  . dextrose 5 % and 0.9 % NaCl with KCl 40 mEq/L 125 mL/hr at 05/24/11 0758   PRN Meds:.acetaminophen, albuterol, simethicone Assessment/Plan: Patient Active Hospital Problem  List: Diarrhea - presumed infectious, C. difficile negative. Patient defervesced on Cipro, and diarrhea resolving. Appreciate infectious disease assistance, follow pending studies and await further ID recommendations.  Hyponatremia/Dehydration- no further significant change on continued IV fluids, will decrease fluids to [facility], check urine lytes and follow  Leukopenia- about the same, beginning to trend upward. secondary to infection, follow recheck continue antibiotics as above pending ID recommendations.   LOS: 3 days   Verdie Wilms C 05/26/2011, 4:49 PM

## 2011-05-27 LAB — SODIUM, URINE, RANDOM: Sodium, Ur: 93 mEq/L

## 2011-05-27 LAB — OSMOLALITY, URINE: Osmolality, Ur: 272 mOsm/kg — ABNORMAL LOW (ref 390–1090)

## 2011-05-27 LAB — CBC
Hemoglobin: 13.5 g/dL (ref 13.0–17.0)
MCH: 28.3 pg (ref 26.0–34.0)
MCHC: 35 g/dL (ref 30.0–36.0)
Platelets: 102 10*3/uL — ABNORMAL LOW (ref 150–400)
RBC: 4.77 MIL/uL (ref 4.22–5.81)

## 2011-05-27 LAB — BASIC METABOLIC PANEL
BUN: 10 mg/dL (ref 6–23)
CO2: 25 mEq/L (ref 19–32)
Calcium: 9 mg/dL (ref 8.4–10.5)
Chloride: 93 mEq/L — ABNORMAL LOW (ref 96–112)
GFR calc Af Amer: >90 mL/min (ref >90)
GFR calc non Af Amer: 86 mL/min — ABNORMAL LOW (ref >90)
GFR calc non Af Amer: >90 mL/min (ref >90)
Glucose, Bld: 98 mg/dL (ref 70–99)
Potassium: 4.4 mEq/L (ref 3.5–5.1)
Potassium: 4.1 mEq/L (ref 3.5–5.1)
Sodium: 124 mEq/L — ABNORMAL LOW (ref 135–145)
Sodium: 127 mEq/L — ABNORMAL LOW (ref 135–145)

## 2011-05-27 LAB — COMPREHENSIVE METABOLIC PANEL
Albumin: 3.5 g/dL (ref 3.5–5.2)
Alkaline Phosphatase: 89 U/L (ref 39–117)
BUN: 9 mg/dL (ref 6–23)
Calcium: 8.8 mg/dL (ref 8.4–10.5)
Creatinine, Ser: 0.96 mg/dL (ref 0.50–1.35)
GFR calc Af Amer: >90 mL/min (ref >90)
Glucose, Bld: 124 mg/dL — ABNORMAL HIGH (ref 70–99)
Total Protein: 6.9 g/dL (ref 6.0–8.3)

## 2011-05-27 LAB — STOOL CULTURE

## 2011-05-27 MED ORDER — ONDANSETRON HCL 4 MG/2ML IJ SOLN: 4.0000 mg | Freq: Four times a day (QID) | INTRAMUSCULAR | Status: DC | PRN

## 2011-05-27 NOTE — Progress Notes (Addendum)
Patient ID: [name] Ward, male   DOB: Nov 16, 1965, 45 y.o.   MRN: [id number] INFECTIOUS DISEASE PROGRESS NOTE ID: Camdin Hegner is a 45 y.o. male   Subjective:  He is feeling better. He has not had any further diarrhea. When I asked if he said nausea he says yes but I believe he may actually be describing anorexia more so than nausea. He has been able to eat some food at home and has not had any vomiting.  Day 4 total antibiotics Abtx:  Anti-infectives     Start     Dose/Rate Route Frequency Ordered Stop   05/25/11 0200   ciprofloxacin (CIPRO) tablet 500 mg        500 mg Oral 2 times daily 05/25/11 0022     05/24/11 1530   ciprofloxacin (CIPRO) tablet 500 mg  Status:  Discontinued        500 mg Oral 2 times daily 05/24/11 1418 05/25/11 0022          Objective Temp (24hrs), Avg:98.4 F (36.9 C), Min:97.8 F (36.6 C), Max:98.9 F (37.2 C)    He is alert and comfortable but worried. He has a very faint papular rash on his back compatible with heat rash. Lungs: Clear Cor: Regular S1 and S2 with no murmurs Abd: Soft with mild diffuse tenderness. Normal bowel sounds.  Lab Results   Lab Results  Component Value Date   WBC 1.8* 05/27/2011   HGB 13.5 05/27/2011   HCT 38.6* 05/27/2011   MCV 80.9 05/27/2011   PLT 102* 05/27/2011    Lab Results  Component Value Date   CREATININE 1.02 05/27/2011   BUN 7 05/27/2011   NA 124* 05/27/2011   K 4.4 05/27/2011   CL 89* 05/27/2011   CO2 25 05/27/2011    Lab Results  Component Value Date   ALT 66* 05/26/2011   AST 94* 05/26/2011   ALKPHOS 89 05/26/2011   BILITOT 0.4 05/26/2011     Microbiology: Recent Results (from the past 240 hour(s))  STOOL CULTURE     Status: Normal   Collection Time   05/24/11  9:09 AM      Component Value Range Status Comment   Specimen Description PERIRECTAL   Final    Special Requests NONE   Final    Culture     Final    Value: NO SALMONELLA, SHIGELLA, CAMPYLOBACTER, OR YERSINIA ISOLATED   Report Status  05/27/2011 FINAL   Final   CLOSTRIDIUM DIFFICILE BY PCR     Status: Normal   Collection Time   05/24/11  3:07 PM      Component Value Range Status Comment   C difficile by pcr NEGATIVE  NEGATIVE  Final     Studies/Results: No results found.   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 He continues to have some mild cytopenias, hyponatremia and mild hepatitis. I do not think the rash on his back is a significant component of this acute illness. It is probably due to heat rash from his sheets. I think it's much less likely the has an acute viral illness such as being the fever. He is quite nervous but overall I believe he is improving and I would simply continue oral ciprofloxacin for now.   Plan: 1. continue ciprofloxacin for 3 more days  2. monitor her CBC, electrolytes and liver enzymes  3. consider discharge home soon  Jonny Ruiz  [facility] for Infectious Diseases [phone] 05/27/2011, 2:24 PM

## 2011-05-27 NOTE — Progress Notes (Signed)
Subjective: Complaining of nausea, able to tolerate some food from home without vomiting.  Objective: Vital signs in last 24 hours: Temp:  [97.8 F (36.6 C)-98.9 F (37.2 C)] 98.5 F (36.9 C) (12/04 1435) Pulse Rate:  [54-69] 54  (12/04 1435) Resp:  [18] 18  (12/04 1435) BP: (94-122)/(53-75) 118/65 mmHg (12/04 1435) SpO2:  [98 %-100 %] 98 % (12/04 1435) Weight:  [67.586 kg (149 lb)-79.561 kg (175 lb 6.4 oz)] 175 lb 6.4 oz (79.561 kg) (12/04 0600) Last BM Date: 05/27/11 Intake/Output from previous day: 12/03 0701 - 12/04 0700 In: 860 [P.O.:360; I.V.:500] Out: 800 [Urine:800] Intake/Output this shift: Total I/O In: 240 [P.O.:240] Out: -     General Appearance:    Alert, cooperative, no distress, appears stated age  Lungs:     Clear to auscultation bilaterally, respirations unlabored   Heart:    Regular rate and rhythm, S1 and S2 normal, no murmur, rub   or gallop  Abdomen:     Soft, non-tender, bowel sounds active all four quadrants,    no masses, no organomegaly  Extremities:   Extremities normal, atraumatic, no cyanosis or edema  Neurologic:   CNII-XII intact, normal strength,        Weight change: -1.614 kg (-3 lb 8.9 oz)  Intake/Output Summary (Last 24 hours) at 05/27/11 1745 Last data filed at 05/27/11 0900  Gross per 24 hour  Intake    740 ml  Output    800 ml  Net    -60 ml    Lab Results:   [facility] 05/27/11 1557 05/27/11 0515  NA 127* 124*  K 4.1 4.4  CL 93* 89*  CO2 24 25  GLUCOSE 123* 98  BUN 10 7  CREATININE 0.92 1.02  CALCIUM 9.1 9.0    Basename 05/27/11 0515 05/26/11 0515  WBC 1.8* 1.6*  HGB 13.5 13.8  HCT 38.6* 39.7  PLT 102* 141*  MCV 80.9 81.7   PT/INR No results found for this basename: LABPROT:2,INR:2 in the last 72 hours ABG No results found for this basename: PHART:2,PCO2:2,PO2:2,HCO3:2 in the last 72 hours  Micro Results: Recent Results (from the past 240 hour(s))  STOOL CULTURE     Status: Normal   Collection Time   05/24/11  9:09 AM      Component Value Range Status Comment   Specimen Description PERIRECTAL   Final    Special Requests NONE   Final    Culture     Final    Value: NO SALMONELLA, SHIGELLA, CAMPYLOBACTER, OR YERSINIA ISOLATED   Report Status 05/27/2011 FINAL   Final   CLOSTRIDIUM DIFFICILE BY PCR     Status: Normal   Collection Time   05/24/11  3:07 PM      Component Value Range Status Comment   C difficile by pcr NEGATIVE  NEGATIVE  Final    Studies/Results: No results found. Medications:  Scheduled Meds:    . ciprofloxacin  500 mg Oral BID  . Fluticasone-Salmeterol  1 puff Inhalation BID  . influenza  inactive virus vaccine  0.5 mL Intramuscular Tomorrow-1000  . multivitamins ther. w/minerals  1 tablet Oral Daily  . rOPINIRole  0.25 mg Oral QHS   Continuous Infusions:    . sodium chloride 150 mL/hr at 05/27/11 1509  . DISCONTD: dextrose 5 % and 0.9 % NaCl with KCl 40 mEq/L 125 mL/hr at 05/24/11 0758   PRN Meds:.acetaminophen, albuterol, ondansetron (ZOFRAN) IV, simethicone Assessment/Plan: Patient Active Hospital Problem List: Diarrheal illness - presumed  infectious/?typhoid syndrome per ID. C. difficile negative. Patient remaining on afebrile on Cipro, and diarrhea resolving. ID recommending Cipro for 3 more days. Follow recheck CBC and  LFTs/Cmet in a.m. Hyponatremia/Dehydration- sodium dropped to 124 this a.m. after arm IV fluids were decreased, improved to 127 and after IV fluids and increased. Leukopenia- slowly trending up. secondary to infection, follow recheck continue antibiotics as per ID recommendations.   LOS: 4 days   [name] Ward C 05/27/2011, 5:45 PM

## 2011-05-28 LAB — CBC
HCT: 36.7 % — ABNORMAL LOW (ref 39.0–52.0)
Hemoglobin: 12.9 g/dL — ABNORMAL LOW (ref 13.0–17.0)
MCH: 28.5 pg (ref 26.0–34.0)
MCHC: 35.1 g/dL (ref 30.0–36.0)
RDW: 12.5 % (ref 11.5–15.5)

## 2011-05-28 MED ORDER — INFLUENZA VIRUS VACC SPLIT PF IM SUSP
0.5000 mL | INTRAMUSCULAR | Status: DC
  Filled 2011-05-28: qty 0.5

## 2011-05-28 NOTE — Progress Notes (Signed)
Subjective: Better. Had formed stool today, able to eat solid food, although somewhat "queasy". No new issues.  Objective: Vital signs in last 24 hours: Temp:  [97.4 F (36.3 C)-97.8 F (36.6 C)] 97.7 F (36.5 C) (12/05 1457) Pulse Rate:  [54-63] 63  (12/05 1457) Resp:  [17-18] 17  (12/05 1457) BP: (104-118)/(64-69) 104/69 mmHg (12/05 1457) SpO2:  [100 %] 100 % (12/05 1457) Weight change:  Last BM Date: 05/28/11  Intake/Output from previous day: 12/04 0701 - 12/05 0700 In: 2551.5 [P.O.:240; I.V.:2311.5] Out: -      Physical Exam:  General: Comfortable, alert, communicative, fully oriented, not short of breath at rest. Ambulating. HEENT:  No clinical pallor, no jaundice, no conjunctival injection or discharge. Hydration is fair. NECK:  Supple, JVP not seen, no carotid bruits, no palpable lymphadenopathy, no palpable goiter. CHEST:  Clinically clear to auscultation, no wheezes, no crackles. HEART:  Sounds 1 and 2 heard, normal, regular, no murmurs. ABDOMEN:  Full, soft,  non-tender, no palpable organomegaly, no palpable masses, normal bowel sounds. GENITALIA:  Not examined. LOWER EXTREMITIES:  No pitting edema, palpable peripheral pulses. MUSCULOSKELETAL SYSTEM:  Generalized osteoarthritic changes, otherwise, normal. CENTRAL NERVOUS SYSTEM:  No focal neurologic deficit on gross examination.  Lab Results:  [facility] 05/28/11 0520 05/27/11 0515  WBC 2.1* 1.8*  HGB 12.9* 13.5  HCT 36.7* 38.6*  PLT 101* 102*    Basename 05/27/11 1810 05/27/11 1557  NA 127* 127*  K 4.1 4.1  CL 94* 93*  CO2 23 24  GLUCOSE 124* 123*  BUN 9 10  CREATININE 0.96 0.92  CALCIUM 8.8 9.1   Recent Results (from the past 240 hour(s))  STOOL CULTURE     Status: Normal   Collection Time   05/24/11  9:09 AM      Component Value Range Status Comment   Specimen Description PERIRECTAL   Final    Special Requests NONE   Final    Culture     Final    Value: NO SALMONELLA, SHIGELLA, CAMPYLOBACTER,  OR YERSINIA ISOLATED   Report Status 05/27/2011 FINAL   Final   CLOSTRIDIUM DIFFICILE BY PCR     Status: Normal   Collection Time   05/24/11  3:07 PM      Component Value Range Status Comment   C difficile by pcr NEGATIVE  NEGATIVE  Final      Studies/Results: No results found.  Medications: Scheduled Meds:   . ciprofloxacin  500 mg Oral BID  . Fluticasone-Salmeterol  1 puff Inhalation BID  . influenza  inactive virus vaccine  0.5 mL Intramuscular Tomorrow-1000  . influenza  inactive virus vaccine  0.5 mL Intramuscular Tomorrow-1000  . multivitamins ther. w/minerals  1 tablet Oral Daily  . rOPINIRole  0.25 mg Oral QHS   Continuous Infusions:   . sodium chloride 150 mL/hr at 05/28/11 0517   PRN Meds:.acetaminophen, albuterol, ondansetron (ZOFRAN) IV, simethicone  Assessment/Plan:  Principal Problem:  *Diarrhea: This is presumed infectious, and possibly query typhoid syndrome per ID. C. difficile PCR is negative. Patient is improving clinically. Will continue current treatment. For 2 more days of Ciprofloxacin, per ID recommendations.  Active Problems. 1. Dehydration/Hyponatremia: Sodium is improving, on ivi NS. Will continue.  2. Leukopenia/Thrombocytopenia: Secondary to infection. Gradual improvement. F/U CBC.  Comment: Will likely discharge on 05/29/11 or 05/30/11.     LOS: 5 days   Terrence Pizana,CHRISTOPHER 05/28/2011, 4:11 PM

## 2011-05-28 NOTE — Progress Notes (Signed)
Patient ID: [name] Ward, male   DOB: 1966/06/02, 45 y.o.   MRN: [id number] INFECTIOUS DISEASE PROGRESS NOTE   Date of Admission:  05/23/2011   Day 5 total antibiotics     . ciprofloxacin  500 mg Oral BID  . Fluticasone-Salmeterol  1 puff Inhalation BID  . influenza  inactive virus vaccine  0.5 mL Intramuscular Tomorrow-1000  . influenza  inactive virus vaccine  0.5 mL Intramuscular Tomorrow-1000  . multivitamins ther. w/minerals  1 tablet Oral Daily  . rOPINIRole  0.25 mg Oral QHS    Subjective: He is feeling much better today. He has had no more nausea and is eating more solid food. He had one semi-formed bowel movement this morning.  Objective: Temp (24hrs), Avg:97.6 F (36.4 C), Min:97.4 F (36.3 C), Max:97.8 F (36.6 C)    General: He is alert, comfortable and in no distress. Skin: He has some erythema of his palms and dorsum of his hands. A mild papular rash on his back is resolving. Lungs: Clear Cor: Regular S1 and S2 no murmurs Abdomen: Soft and nontender with normal bowel sounds   Lab Results Lab Results  Component Value Date   WBC 2.1* 05/28/2011   HGB 12.9* 05/28/2011   HCT 36.7* 05/28/2011   MCV 81.0 05/28/2011   PLT 101* 05/28/2011    Lab Results  Component Value Date   CREATININE 0.96 05/27/2011   BUN 9 05/27/2011   NA 127* 05/27/2011   K 4.1 05/27/2011   CL 94* 05/27/2011   CO2 23 05/27/2011    Lab Results  Component Value Date   ALT 225* 05/27/2011   AST 248* 05/27/2011   ALKPHOS 89 05/27/2011   BILITOT 0.3 05/27/2011     Microbiology: Recent Results (from the past 240 hour(s))  STOOL CULTURE     Status: Normal   Collection Time   05/24/11  9:09 AM      Component Value Range Status Comment   Specimen Description PERIRECTAL   Final    Special Requests NONE   Final    Culture     Final    Value: NO SALMONELLA, SHIGELLA, CAMPYLOBACTER, OR YERSINIA ISOLATED   Report Status 05/27/2011 FINAL   Final   CLOSTRIDIUM DIFFICILE BY PCR     Status: Normal   Collection Time   05/24/11  3:07 PM      Component Value Range Status Comment   C difficile by pcr NEGATIVE  NEGATIVE  Final     Studies/Results: No results found.   Assessment: He is improving slowly but steadily. I would recheck his CBC and complete metabolic panel in the morning and continue oral ciprofloxacin for 2 more days. Plan: 1. Continue oral ciprofloxacin for 2 more days 2. Check CBC and complete metabolic panel in the morning 3. Consider discharge tomorrow.   Yulitza Shorts [facility] for Infectious Diseases [phone] 05/28/2011, 4:53 PM

## 2011-05-29 LAB — CBC
HCT: 37.1 % — ABNORMAL LOW (ref 39.0–52.0)
Hemoglobin: 12.9 g/dL — ABNORMAL LOW (ref 13.0–17.0)
MCV: 82.3 fL (ref 78.0–100.0)
RBC: 4.51 MIL/uL (ref 4.22–5.81)
RDW: 12.7 % (ref 11.5–15.5)
WBC: 3.2 10*3/uL — ABNORMAL LOW (ref 4.0–10.5)

## 2011-05-29 LAB — BASIC METABOLIC PANEL
Chloride: 97 mEq/L (ref 96–112)
GFR calc non Af Amer: >90 mL/min (ref >90)
Glucose, Bld: 97 mg/dL (ref 70–99)
Potassium: 4.3 mEq/L (ref 3.5–5.1)
Sodium: 129 mEq/L — ABNORMAL LOW (ref 135–145)

## 2011-05-29 NOTE — Progress Notes (Signed)
Physician Discharge Summary  Patient ID: [name] Ward MRN: [id number] DOB/AGE: [birth year] 45 y.o.  Admit date: 05/23/2011 Discharge date: 05/29/2011  Primary Care Physician:  No primary provider on file.   Discharge Diagnoses:    Patient Active Problem List  Diagnoses  . Diarrhea  . Hyponatremia  . Dehydration    Current Discharge Medication List    CONTINUE these medications which have NOT CHANGED   Details  acetaminophen (TYLENOL) 500 MG tablet Take 500-1,000 mg by mouth every 6 (six) hours as needed. For pain.     albuterol (PROVENTIL HFA;VENTOLIN HFA) 108 (90 BASE) MCG/ACT inhaler Inhale 2 puffs into the lungs every 6 (six) hours as needed.      bismuth subsalicylate (PEPTO BISMOL) 262 MG chewable tablet Chew 262 mg by mouth 3 (three) times daily as needed. For indigestion.     ciprofloxacin (CIPRO) 500 MG tablet Take 500 mg by mouth 2 (two) times daily. 3 day course of therapy, not completed.     etodolac (LODINE) 500 MG tablet Take 500 mg by mouth as needed.      Fluticasone-Salmeterol (ADVAIR) 100-50 MCG/DOSE AEPB Inhale 1 puff into the lungs as needed.      GLUCOSAMINE HCL PO Take 1 capsule by mouth daily.      Multiple Vitamins-Minerals (MULTIVITAMINS THER. W/MINERALS) TABS Take 1 tablet by mouth daily.      rOPINIRole (REQUIP) 0.25 MG tablet Take 0.25 mg by mouth at bedtime.      vitamin C (ASCORBIC ACID) 250 MG tablet Take 250 mg by mouth daily.           Disposition and Follow-up:  Follow up routinely, with primary care physician. Consults:  ID  Dr. Cliffton Ward.   Significant Diagnostic Studies:  No results found.  Brief H and P: For complete details, refer to admission H and P. However,  in brief, this is a 45 y.o. Male, with history of chronic lower back pain, restless leg syndrome, allergies, refered to the emergency room from his primary care physician for hyponatremia of 118 and persistent watery diarrhea which developed 2 days upon returning  to the [place], after a trip to [place]. He was admitted for further evaluation, investigation and management.  Physical Exam: On 05/29/11. General: Comfortable, alert, communicative, fully oriented, not short of breath at rest. Ambulating, has formed stool, no abdominal pain. HEENT: No clinical pallor, no jaundice, no conjunctival injection or discharge. Hydration is fair.  NECK: Supple, JVP not seen, no carotid bruits, no palpable lymphadenopathy, no palpable goiter.  CHEST: Clinically clear to auscultation, no wheezes, no crackles.  HEART: Sounds 1 and 2 heard, normal, regular, no murmurs.  ABDOMEN: Full, soft, non-tender, no palpable organomegaly, no palpable masses, normal bowel sounds.  GENITALIA: Not examined.  LOWER EXTREMITIES: No pitting edema, palpable peripheral pulses.  MUSCULOSKELETAL SYSTEM: Unremarkable. CENTRAL NERVOUS SYSTEM: No focal neurologic deficit on gross examination.   Hospital Course:  Principal Problem:  *Diarrhea: Patient presented as described above. There was no clustering of cases or ingestion of unusual foods. Given his antecedent travel history however, this was deemed an infectious diarrhea. He was managed with Ciprofloxacin, with satisfactory clinical response. 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 Patient completed Ciprofloxacin course on 05/29/11, and diarrhea had by then, resolved. C. difficile PCR was negative. Patient is improving clinically.  Active Problems.  1. Dehydration/Hyponatremia: Patient was profoundly dehydrated and hyponatremic on  presentation, secondary to diarrheal illness. He was managed with intravenous saline infusion, and by 05/29/11, serum sodium was 129./ Further improvement is anticipated. 2. Leukopenia/Thrombocytopenia: This was secondary to above infective illness. Over the course of his hospitalization, patient's wcc  improved gradually from 1.9, to 3.2 on 05/29/11. Platelet count which was as low as 102 initially, had improved to 132 on 05/29/11. We shall defer further follow up of CBC, to patient's primary MD. Of note, HIV test was negative.   Comment: Patient was stable for discharge on 05/29/11.     Time spent on Discharge: 35 mins.  Signed: [name],[name] 05/29/2011, 11:57 AM

## 2011-05-29 NOTE — Progress Notes (Signed)
CARE MANAGEMENT NOTE 05/29/2011  Patient:  [name] Ward,[name] Ward   Account Number:  [id number]  Date Initiated:  05/29/2011  Documentation initiated by:  DAVIS,RHONDA  Subjective/Objective Assessment:   pt admitted with nausea , vomiting and diarrhea AFTER TRAVELING TO [place],     Action/Plan:   lives at home   Anticipated DC Date:  05/29/2011   Anticipated DC Plan:  HOME/SELF CARE  In-house referral  NA      DC Planning Services  NA      [facility] Choice  NA   Choice offered to / List presented to:  NA   DME arranged  NA      DME agency  NA     HH arranged  NA      HH agency  NA   Status of service:  Completed, signed off Medicare Important Message given?  NA - LOS <3 / Initial given by admissions (If response is "NO", the following Medicare IM given date fields will be blank) Date Medicare IM given:   Date Additional Medicare IM given:    Discharge Disposition:  HOME/SELF CARE  Per UR Regulation:  Reviewed for med. necessity/level of care/duration of stay  Comments:  [id number]/Rhonda Davis,RN,BSN,CCM/CASE MANAGMENT NOTE/PATIENT DCD HOME WITH OUT NEEDS

## 2011-05-29 NOTE — Progress Notes (Signed)
Patient discharged home with wife and daughter, alert and oriented, discharge instructions given, patient verbalize understanding of discharge orders, patient is stable condition at this time

## 2011-09-09 ENCOUNTER — Other Ambulatory Visit: Payer: Self-pay | Admitting: Family Medicine

## 2011-09-09 ENCOUNTER — Ambulatory Visit
Admission: RE | Admit: 2011-09-09 | Discharge: 2011-09-09 | Disposition: A | Discharge status: Home / Self Care | Payer: Managed Care, Other (non HMO) | Source: Ambulatory Visit | Attending: Family Medicine | Admitting: Family Medicine

## 2011-09-09 DIAGNOSIS — R52 Pain, unspecified: Secondary | ICD-10-CM

## 2011-09-09 DIAGNOSIS — R059 Cough, unspecified: Secondary | ICD-10-CM

## 2011-09-09 DIAGNOSIS — R05 Cough: Secondary | ICD-10-CM

## 2011-09-09 DIAGNOSIS — R0602 Shortness of breath: Secondary | ICD-10-CM

## 2012-10-21 ENCOUNTER — Ambulatory Visit
Admission: RE | Admit: 2012-10-21 | Discharge: 2012-10-21 | Disposition: A | Discharge status: Home / Self Care | Payer: Managed Care, Other (non HMO) | Source: Ambulatory Visit | Attending: Family Medicine | Admitting: Family Medicine

## 2012-10-21 ENCOUNTER — Other Ambulatory Visit: Payer: Self-pay | Admitting: Family Medicine

## 2012-10-21 DIAGNOSIS — R05 Cough: Secondary | ICD-10-CM

## 2013-09-28 ENCOUNTER — Encounter: Payer: Self-pay | Admitting: Neurology

## 2013-09-30 ENCOUNTER — Encounter (INDEPENDENT_AMBULATORY_CARE_PROVIDER_SITE_OTHER): Payer: Self-pay

## 2013-09-30 ENCOUNTER — Encounter: Payer: Self-pay | Admitting: Neurology

## 2013-09-30 ENCOUNTER — Ambulatory Visit (INDEPENDENT_AMBULATORY_CARE_PROVIDER_SITE_OTHER): Payer: Managed Care, Other (non HMO) | Admitting: Neurology

## 2013-09-30 VITALS — BP 98/59 | HR 57 | Ht 67.5 in | Wt 164.0 lb

## 2013-09-30 DIAGNOSIS — G2581 Restless legs syndrome: Secondary | ICD-10-CM | POA: Insufficient documentation

## 2013-09-30 MED ORDER — GABAPENTIN 100 MG PO CAPS: 100.0000 mg | ORAL_CAPSULE | Freq: Every day | ORAL | Status: DC

## 2013-09-30 NOTE — Progress Notes (Signed)
Reason for visit: Restless leg syndrome  [name] Ward is a 48 y.o. male  History of present illness:  [name] Ward is a 49 year old right-handed male with a history of restless leg syndrome dating back to 2007. The patient indicates that both legs are involved, and he may have symptoms within several minutes after lying down before going to sleep. The patient has achy sensation in the legs and the right leg is slightly worse than the left. The patient have a jerk or twitch. The patient may wake up at least 2 times during the night, and occasionally he will have some difficulty getting back to sleep. The patient indicates that before he goes to bed, he will stretch out, and move the feet in a circular fashion to help alleviate the symptoms and help him get to sleep at night. The patient has done quite well on low-dose Requip taking 0.25 mg at night for number of years. The patient indicates that more recently, this dose has become a bit less effective. The patient also has some intermittent low back pain and hip discomfort. The patient is active however, and he plays soccer. The patient indicates that he feels much better when he is up and active. The patient will occasionally take Lodine for the back pain. Slight weight gain will worsen the back issues. The patient is sent to this office for further evaluation.   Past Medical History  Diagnosis Date  . Chronic back pain greater than 3 months duration   . Asthma   . RLS (restless legs syndrome)   . Seasonal allergies     Past Surgical History  Procedure Laterality Date  . Appendectomy    . Dental surgery      Family History  Problem Relation Age of Onset  . Arthritis Mother   . Restless legs syndrome Mother   . Bladder Cancer Father   . Restless legs syndrome Father     Social history:  reports that he has quit smoking. His smoking use included Cigarettes. He has a 1.2 pack-year smoking history. He has never used smokeless tobacco. He  reports that he drinks alcohol. He reports that he does not use illicit drugs.  Medications:  Current Outpatient Prescriptions on File Prior to Visit  Medication Sig Dispense Refill  . acetaminophen (TYLENOL) 500 MG tablet Take 500-1,000 mg by mouth every 6 (six) hours as needed. For pain.       Marland Kitchen albuterol (PROVENTIL HFA;VENTOLIN HFA) 108 (90 BASE) MCG/ACT inhaler Inhale 2 puffs into the lungs every 6 (six) hours as needed.        . etodolac (LODINE) 500 MG tablet Take 500 mg by mouth as needed.        . Fluticasone-Salmeterol (ADVAIR) 100-50 MCG/DOSE AEPB Inhale 1 puff into the lungs as needed.        Marland Kitchen GLUCOSAMINE HCL PO Take 1 capsule by mouth daily.        . Multiple Vitamins-Minerals (MULTIVITAMINS THER. W/MINERALS) TABS Take 1 tablet by mouth daily.        Marland Kitchen rOPINIRole (REQUIP) 0.25 MG tablet Take 0.25 mg by mouth at bedtime.        . vitamin C (ASCORBIC ACID) 250 MG tablet Take 250 mg by mouth daily.         No current facility-administered medications on file prior to visit.     No Known Allergies  ROS:  Out of a complete 14 system review of symptoms, the patient complains  only of the following symptoms, and all other reviewed systems are negative.  Achy muscles  Not enough sleep Her restless legs  Blood pressure 98/59, pulse 57, height 5' 7.5" (1.715 m), weight 164 lb (74.39 kg).  Physical Exam  General: The patient is alert and cooperative at the time of the examination.  Eyes: Pupils are equal, round, and reactive to light. Discs are flat bilaterally.  Neck: The neck is supple, no carotid bruits are noted.  Respiratory: The respiratory examination is clear.  Cardiovascular: The cardiovascular examination reveals a regular rate and rhythm, no obvious murmurs or rubs are noted.  Neuromuscular: Range of movement of the lumbosacral spine is full.  Skin: Extremities are without significant edema.  Neurologic Exam  Mental status: The patient is alert and oriented  x 3 at the time of the examination. The patient has apparent normal recent and remote memory, with an apparently normal attention span and concentration ability.  Cranial nerves: Facial symmetry is present. There is good sensation of the face to pinprick and soft touch bilaterally. The strength of the facial muscles and the muscles to head turning and shoulder shrug are normal bilaterally. Speech is well enunciated, no aphasia or dysarthria is noted. Extraocular movements are full. Visual fields are full. The tongue is midline, and the patient has symmetric elevation of the soft palate. No obvious hearing deficits are noted.  Motor: The motor testing reveals 5 over 5 strength of all 4 extremities. Good symmetric motor tone is noted throughout.  Sensory: Sensory testing is intact to pinprick, soft touch, vibration sensation, and position sense on all 4 extremities. No evidence of extinction is noted.  Coordination: Cerebellar testing reveals good finger-nose-finger and heel-to-shin bilaterally.  Gait and station: Gait is normal. Tandem gait is normal. Romberg is negative. No drift is seen.  Reflexes: Deep tendon reflexes are symmetric and normal bilaterally. Toes are downgoing bilaterally.   Assessment/Plan:  One. Restless leg syndrome  The patient is having some symptoms in the legs, right greater than left, and some associated occasional back issues. The patient will be given a trial on low-dose gabapentin at night. If this is not effective, the patient will switch back to Requip in a higher dose of 0.5 mg at night. The patient indicates that if he comes off the medication, he feels nervous and jittery. The patient will followup in 6 months.   Marlan Palau. Keith Ameilia Rattan MD 09/30/2013 7:39 PM  [facility] [address] [place], [place] [id number]  Phone [phone] Fax [phone]

## 2013-09-30 NOTE — Patient Instructions (Signed)
Restless Legs Syndrome Restless legs syndrome is a movement disorder. It may also be called a sensori-motor disorder.  CAUSES  No one knows what specifically causes restless legs syndrome, but it tends to run in families. It is also more common in people with low iron, in pregnancy, in people who need dialysis, and those with nerve damage (neuropathy).Some medications may make restless legs syndrome worse.Those medications include drugs to treat high blood pressure, some heart conditions, nausea, colds, allergies, and depression. SYMPTOMS Symptoms include uncomfortable sensations in the legs. These leg sensations are worse during periods of inactivity or rest. They are also worse while sitting or lying down. Individuals that have the disorder describe sensations in the legs that feel like:  Pulling.  Drawing.  Crawling.  Worming.  Boring.  Tingling.  Pins and needles.  Prickling.  Pain. The sensations are usually accompanied by an overwhelming urge to move the legs. Sudden muscle jerks may also occur. Movement provides temporary relief from the discomfort. In rare cases, the arms may also be affected. Symptoms may interfere with going to sleep (sleep onset insomnia). Restless legs syndrome may also be related to periodic limb movement disorder (PLMD). PLMD is another more common motor disorder. It also causes interrupted sleep. The symptoms from PLMD usually occur most often when you are awake. TREATMENT  Treatment for restless legs syndrome is symptomatic. This means that the symptoms are treated.   Massage and cold compresses may provide temporary relief.  Walk, stretch, or take a cold or hot bath.  Get regular exercise and a good night's sleep.  Avoid caffeine, alcohol, nicotine, and medications that can make it worse.  Do activities that provide mental stimulation like discussions, needlework, and video games. These may be helpful if you are not able to walk or  stretch. Some medications are effective in relieving the symptoms. However, many of these medications have side effects. Ask your caregiver about medications that may help your symptoms. Correcting iron deficiency may improve symptoms for some patients. Document Released: 05/30/2002 Document Revised: 09/01/2011 Document Reviewed: 09/05/2010 [employer] Patient Information 2014 [employer].  

## 2013-10-04 ENCOUNTER — Telehealth: Payer: Self-pay | Admitting: *Deleted

## 2013-10-04 NOTE — Telephone Encounter (Signed)
Pt called and stated he saw Dr Anne HahnWillis 10/04/13 and was prescribed gabapentin (NEURONTIN) 100 MG capsule.  He's questioning if he should take the new med along with rOPINIRole (REQUIP) 0.25 MG tablet?  He wasn't sure if he could take together, if not he wants to discontinue with Gabapentin due to unable to sleep.  He had the option to increase the mg for the ropinirole.  Please call patient regarding this matter.

## 2013-10-04 NOTE — Telephone Encounter (Signed)
I called the patient. The patient went off of ropinirole and he took the gabapentin. I will take both together. If the patient tolerates the low dose gabapentin at 100 mg at night, we can increase the dose and in the future potentially get him off of the ropinirole.

## 2013-10-04 NOTE — Telephone Encounter (Signed)
Pt had some questions about his medication that Dr. Anne HahnWillis prescribed him, wanting to know how he should be stating. Please advise

## 2014-02-15 ENCOUNTER — Telehealth: Payer: Self-pay | Admitting: Neurology

## 2014-02-15 MED ORDER — ROPINIROLE HCL 1 MG PO TABS: 1.0000 mg | ORAL_TABLET | Freq: Every day | ORAL | Status: DC

## 2014-02-15 NOTE — Telephone Encounter (Signed)
I called the patient, talked with the wife. The patient was on 0.5 mg of Requip at night, but the generic manufacturer changed, and the patient is now having increased symptoms. I'll have him take 1.0 mg at night, and I'll call in a 1 mg tablet.

## 2014-02-15 NOTE — Telephone Encounter (Signed)
Spouse stated [employer] isn't making medication for rOPINIRole (REQUIP) 0.25 MG tablet.  Questioning if there's another pharmceutical that could get this medication.  Please return call to [phone] anytime and may leave detailed message if not available.

## 2014-02-15 NOTE — Telephone Encounter (Signed)
I called the patient back to clarify.  Ms. Glance said the patent was getting [employer] for Requip 0.5mg , but states it is no longer made.  The pharmacy gave them a new manufacturer (she is unsure which one it is), and says for the last 3 days, he has not been able to sleep.  They feel the change in manufacturer has caused the issue and would like to discuss this with the provider.  Unsure if dose needs to be adjusted?   Please advise.  Thank you.

## 2014-02-26 ENCOUNTER — Other Ambulatory Visit: Payer: Self-pay | Admitting: Neurology

## 2014-04-03 ENCOUNTER — Ambulatory Visit: Payer: Managed Care, Other (non HMO) | Admitting: Nurse Practitioner

## 2014-05-30 ENCOUNTER — Ambulatory Visit: Payer: Managed Care, Other (non HMO) | Admitting: Nurse Practitioner

## 2014-06-08 ENCOUNTER — Encounter: Payer: Self-pay | Admitting: Nurse Practitioner

## 2014-09-26 ENCOUNTER — Other Ambulatory Visit: Payer: Self-pay | Admitting: Neurology

## 2014-09-27 NOTE — Telephone Encounter (Signed)
Patient no showed last appt and cancelled the appt scheduled prior to that.

## 2014-12-20 ENCOUNTER — Other Ambulatory Visit: Payer: Self-pay | Admitting: Neurology

## 2014-12-20 NOTE — Telephone Encounter (Signed)
No showed last appt  

## 2014-12-22 NOTE — Telephone Encounter (Signed)
I have not been able to reach the patient.  Sent enough meds so they do not run out over the holiday weekend. 

## 2015-07-18 ENCOUNTER — Other Ambulatory Visit: Payer: Self-pay | Admitting: Neurology

## 2015-07-19 ENCOUNTER — Other Ambulatory Visit: Payer: Self-pay

## 2015-09-18 ENCOUNTER — Other Ambulatory Visit: Payer: Self-pay | Admitting: Family Medicine

## 2015-09-18 ENCOUNTER — Ambulatory Visit
Admission: RE | Admit: 2015-09-18 | Discharge: 2015-09-18 | Disposition: A | Discharge status: Home / Self Care | Payer: Managed Care, Other (non HMO) | Source: Ambulatory Visit | Attending: Family Medicine | Admitting: Family Medicine

## 2015-09-18 DIAGNOSIS — R042 Hemoptysis: Secondary | ICD-10-CM

## 2015-10-17 ENCOUNTER — Other Ambulatory Visit: Payer: Self-pay | Admitting: Neurology

## 2015-11-21 ENCOUNTER — Ambulatory Visit (INDEPENDENT_AMBULATORY_CARE_PROVIDER_SITE_OTHER): Payer: Managed Care, Other (non HMO) | Admitting: Neurology

## 2015-11-21 ENCOUNTER — Encounter: Payer: Self-pay | Admitting: Neurology

## 2015-11-21 VITALS — BP 122/74 | HR 76 | Ht 68.0 in | Wt 158.0 lb

## 2015-11-21 DIAGNOSIS — G2581 Restless legs syndrome: Secondary | ICD-10-CM | POA: Diagnosis not present

## 2015-11-21 MED ORDER — ROPINIROLE HCL 1 MG PO TABS: 1.0000 mg | ORAL_TABLET | Freq: Every day | ORAL | Status: DC

## 2015-11-21 NOTE — Progress Notes (Signed)
Reason for visit: Restless leg syndrome  [name] Ward is an 50 y.o. male  History of present illness:  [name] Ward is a 50 year old right-handed [place] male with a history of restless leg syndrome. The patient has had gradual worsening of his symptoms. He has been on Requip taking 0.5 mg at night, and 100 mg gabapentin at night. He has issues every night with difficulty getting to sleep, he is fatigued and sleepy during the day. He may have symptoms during the daytime if he is inactive. He also notes some problems with back pain and some right sided upper hip pain. He remains active, working out on a regular basis, he plays soccer at times. The patient indicates that his father also has restless leg syndrome. He was given a prescription for the 1 mg Requip tablet, but he never took this. He has tried other entities such as tumeric and mustard oil this if this would help, but it has not been effective. The patient denies any muscle cramps in the legs. He returns to this office for an evaluation. The patient occasionally may take Lodine for his back pain.  Past Medical History  Diagnosis Date  . Chronic back pain greater than 3 months duration   . Asthma   . RLS (restless legs syndrome)   . Seasonal allergies     Past Surgical History  Procedure Laterality Date  . Appendectomy    . Dental surgery      Family History  Problem Relation Age of Onset  . Arthritis Mother   . Restless legs syndrome Mother   . Bladder Cancer Father   . Restless legs syndrome Father     Social history:  reports that he has quit smoking. His smoking use included Cigarettes. He has a 1.2 pack-year smoking history. He has never used smokeless tobacco. He reports that he drinks alcohol. He reports that he does not use illicit drugs.   No Known Allergies  Medications:  Prior to Admission medications   Medication Sig Start Date End Date Taking? Authorizing Provider  acetaminophen (TYLENOL) 500 MG tablet Take  500-1,000 mg by mouth every 6 (six) hours as needed. For pain.     Historical Provider, MD  albuterol (PROVENTIL HFA;VENTOLIN HFA) 108 (90 BASE) MCG/ACT inhaler Inhale 2 puffs into the lungs every 6 (six) hours as needed.      Historical Provider, MD  etodolac (LODINE) 500 MG tablet Take 500 mg by mouth as needed.      Historical Provider, MD  Fluticasone-Salmeterol (ADVAIR) 100-50 MCG/DOSE AEPB Inhale 1 puff into the lungs as needed.      Historical Provider, MD  gabapentin (NEURONTIN) 100 MG capsule TAKE 1 CAPSULE AT BEDTIME 12/22/14   York Spanielharles K Kalika Smay, MD  GLUCOSAMINE HCL PO Take 1 capsule by mouth daily.      Historical Provider, MD  Multiple Vitamins-Minerals (MULTIVITAMINS THER. W/MINERALS) TABS Take 1 tablet by mouth daily.      Historical Provider, MD  rOPINIRole (REQUIP) 1 MG tablet Take 1 tablet (1 mg total) by mouth at bedtime. 02/15/14   York Spanielharles K Cloyce Paterson, MD  vitamin C (ASCORBIC ACID) 250 MG tablet Take 250 mg by mouth daily.      Historical Provider, MD    ROS:  Out of a complete 14 system review of symptoms, the patient complains only of the following symptoms, and all other reviewed systems are negative.  Eye itching Restless legs, insomnia Back pain, achy muscles, muscle cramps  Blood  pressure 122/74, pulse 76, height  (1.727 m), weight 158 lb (71.668 kg).  Physical Exam  General: The patient is alert and cooperative at the time of the examination.  Neuromuscular: Range of movement of the lumbar spine is full.  Skin: No significant peripheral edema is noted.   Neurologic Exam  Mental status: The patient is alert and oriented x 3 at the time of the examination. The patient has apparent normal recent and remote memory, with an apparently normal attention span and concentration ability.   Cranial nerves: Facial symmetry is present. Speech is normal, no aphasia or dysarthria is noted. Extraocular movements are full. Visual fields are full.  Motor: The patient has  good strength in all 4 extremities.  Sensory examination: Soft touch sensation is symmetric on the face, arms, and legs.  Coordination: The patient has good finger-nose-finger and heel-to-shin bilaterally.  Gait and station: The patient has a normal gait. Tandem gait is normal. Romberg is negative. No drift is seen.  Reflexes: Deep tendon reflexes are symmetric.   Assessment/Plan:  1. Restless leg syndrome  2. Chronic low back pain  The patient is having increasing symptoms with his restless legs. In the past, he has had an anemia. We will check a CBC today, iron level, ferritin level. The patient will go up on the Requip taking 1 mg at night. A prescription was called in. He will follow-up in 6 months, sooner if needed.  Marlan Palau MD 11/21/2015 7:22 PM  [facility] [address] [place], [place] [address]  Phone [phone] Fax [phone]

## 2015-11-21 NOTE — Patient Instructions (Signed)
Restless Legs Syndrome Restless legs syndrome is a condition that causes uncomfortable feelings or sensations in the legs, especially while sitting or lying down. The sensations usually cause an overwhelming urge to move the legs. The arms can also sometimes be affected. The condition can range from mild to severe. The symptoms often interfere with a person's ability to sleep. CAUSES The cause of this condition is not known. RISK FACTORS This condition is more likely to develop in:  People who are older than age 50.  Pregnant women. In general, restless legs syndrome is more common in women than in men.  People who have a family history of the condition.  People who have certain medical conditions, such as iron deficiency, kidney disease, Parkinson disease, or nerve damage.  People who take certain medicines, such as medicines for high blood pressure, nausea, colds, allergies, depression, and some heart conditions. SYMPTOMS The main symptom of this condition is uncomfortable sensations in the legs. These sensations may be:  Described as pulling, tingling, prickling, throbbing, crawling, or burning.  Worse while you are sitting or lying down.  Worse during periods of rest or inactivity.  Worse at night, often interfering with your sleep.  Accompanied by a very strong urge to move your legs.  Temporarily relieved by movement of your legs. The sensations usually affect both sides of the body. The arms can also be affected, but this is rare. People who have this condition often have tiredness during the day because of their lack of sleep at night. DIAGNOSIS This condition may be diagnosed based on your description of the symptoms. You may also have tests, including blood tests, to check for other conditions that may lead to your symptoms. In some cases, you may be asked to spend some time in a sleep lab so your sleeping can be monitored. TREATMENT Treatment for this condition is  focused on managing the symptoms. Treatment may include:  Self-help and lifestyle changes.  Medicines. HOME CARE INSTRUCTIONS  Take medicines only as directed by your health care provider.  Try these methods to get temporary relief from the uncomfortable sensations:  Massage your legs.  Walk or stretch.  Take a cold or hot bath.  Practice good sleep habits. For example, go to bed and get up at the same time every day.  Exercise regularly.  Practice ways of relaxing, such as yoga or meditation.  Avoid caffeine and alcohol.  Do not use any tobacco products, including cigarettes, chewing tobacco, or electronic cigarettes. If you need help quitting, ask your health care provider.  Keep all follow-up visits as directed by your health care provider. This is important. SEEK MEDICAL CARE IF: Your symptoms do not improve with treatment, or they get worse.   This information is not intended to replace advice given to you by your health care provider. Make sure you discuss any questions you have with your health care provider.   Document Released: 05/30/2002 Document Revised: 10/24/2014 Document Reviewed: 06/05/2014 Elsevier Interactive Patient Education 2016 [employer] Inc.  

## 2015-11-22 ENCOUNTER — Telehealth: Payer: Self-pay

## 2015-11-22 LAB — CBC WITH DIFFERENTIAL/PLATELET
BASOS: 1 %
Basophils Absolute: 0 10*3/uL (ref 0.0–0.2)
EOS (ABSOLUTE): 0.1 10*3/uL (ref 0.0–0.4)
Eos: 2 %
HEMATOCRIT: 40.3 % (ref 37.5–51.0)
HEMOGLOBIN: 13.4 g/dL (ref 12.6–17.7)
IMMATURE GRANS (ABS): 0 10*3/uL (ref 0.0–0.1)
Immature Granulocytes: 0 %
Lymphocytes Absolute: 1.3 10*3/uL (ref 0.7–3.1)
Lymphs: 22 %
MCH: 28 pg (ref 26.6–33.0)
MCHC: 33.3 g/dL (ref 31.5–35.7)
MCV: 84 fL (ref 79–97)
MONOCYTES: 7 %
Monocytes Absolute: 0.4 10*3/uL (ref 0.1–0.9)
NEUTROS ABS: 4 10*3/uL (ref 1.4–7.0)
Neutrophils: 68 %
Platelets: 295 10*3/uL (ref 150–379)
RBC: 4.78 x10E6/uL (ref 4.14–5.80)
RDW: 14 % (ref 12.3–15.4)
WBC: 5.9 10*3/uL (ref 3.4–10.8)

## 2015-11-22 LAB — IRON AND TIBC
IRON SATURATION: 22 % (ref 15–55)
Iron: 61 ug/dL (ref 38–169)
Total Iron Binding Capacity: 277 ug/dL (ref 250–450)
UIBC: 216 ug/dL (ref 111–343)

## 2015-11-22 LAB — FERRITIN: FERRITIN: 48 ng/mL (ref 30–400)

## 2015-11-22 NOTE — Telephone Encounter (Signed)
Called pt w/ unremarkable lab results. Verbalized understanding and appreciation for call. 

## 2015-11-22 NOTE — Telephone Encounter (Signed)
-----   Message from York Spanielharles K Willis, MD sent at 11/22/2015 10:03 AM EDT -----  The blood work results are unremarkable. Please call the patient.  ----- Message -----    From: [facility] Lab Results In Interface    Sent: 11/22/2015   7:42 AM      To: York Spanielharles K Willis, MD

## 2016-02-03 ENCOUNTER — Other Ambulatory Visit: Payer: Self-pay | Admitting: Neurology

## 2016-03-10 ENCOUNTER — Ambulatory Visit
Admission: RE | Admit: 2016-03-10 | Discharge: 2016-03-10 | Disposition: A | Discharge status: Home / Self Care | Payer: Managed Care, Other (non HMO) | Source: Ambulatory Visit | Attending: Emergency Medicine | Admitting: Emergency Medicine

## 2016-03-10 ENCOUNTER — Other Ambulatory Visit: Payer: Self-pay | Admitting: Emergency Medicine

## 2016-03-10 DIAGNOSIS — R109 Unspecified abdominal pain: Secondary | ICD-10-CM

## 2016-05-22 ENCOUNTER — Encounter: Payer: Self-pay | Admitting: Adult Health

## 2016-05-22 ENCOUNTER — Ambulatory Visit (INDEPENDENT_AMBULATORY_CARE_PROVIDER_SITE_OTHER): Payer: Managed Care, Other (non HMO) | Admitting: Adult Health

## 2016-05-22 VITALS — BP 119/73 | HR 66 | Wt 164.0 lb

## 2016-05-22 DIAGNOSIS — G2581 Restless legs syndrome: Secondary | ICD-10-CM

## 2016-05-22 NOTE — Progress Notes (Signed)
PATIENT: [name] Ward DOB: [birth year]  REASON FOR VISIT: follow up-restless legs HISTORY FROM: patient  HISTORY OF PRESENT ILLNESS: [name] is a 50 year old male with a history of restless legs and normal. He returns today for follow-up. He is currently taking Requip 1 mg at bedtime and 100 mg of gabapentin. He reports that he continues to have symptoms. He states that the symptoms normally start as soon as he goes to bed. He usually has to get back up and move around and then try to go back to bed. He states after he increased Requip he did get temporary relief for about one month. He states that if he is exhausted or exercises before bedtime his symptoms are better. He has also been trying to massage his legs with hot oil before bedtime which he has also found beneficial. The patient states occasionally once he goes to sleep he may be woken up with symptoms however this is not nightly. The patient is not interested in increasing his medication or starting anything new today. He returns today for an evaluation.  HISTORY 11/21/15: [name] is a 50 year old right-handed [place] male with a history of restless leg syndrome. The patient has had gradual worsening of his symptoms. He has been on Requip taking 0.5 mg at night, and 100 mg gabapentin at night. He has issues every night with difficulty getting to sleep, he is fatigued and sleepy during the day. He may have symptoms during the daytime if he is inactive. He also notes some problems with back pain and some right sided upper hip pain. He remains active, working out on a regular basis, he plays soccer at times. The patient indicates that his father also has restless leg syndrome. He was given a prescription for the 1 mg Requip tablet, but he never took this. He has tried other entities such as tumeric and mustard oil this if this would help, but it has not been effective. The patient denies any muscle cramps in the legs. He returns to this office for  an evaluation. The patient occasionally may take Lodine for his back pain.   REVIEW OF SYSTEMS: Out of a complete 14 system review of symptoms, the patient complains only of the following symptoms, and all other reviewed systems are negative.  See history of present illness  ALLERGIES: No Known Allergies  HOME MEDICATIONS: Outpatient Medications Prior to Visit  Medication Sig Dispense Refill  . acetaminophen (TYLENOL) 500 MG tablet Take 500-1,000 mg by mouth every 6 (six) hours as needed. For pain.     Marland Kitchen. albuterol (PROVENTIL HFA;VENTOLIN HFA) 108 (90 BASE) MCG/ACT inhaler Inhale 2 puffs into the lungs every 6 (six) hours as needed.      . etodolac (LODINE) 500 MG tablet Take 500 mg by mouth as needed.      . Fluticasone-Salmeterol (ADVAIR) 100-50 MCG/DOSE AEPB Inhale 1 puff into the lungs as needed.      . gabapentin (NEURONTIN) 100 MG capsule TAKE 1 CAPSULE AT BEDTIME  (PLEASE MAKE AN APPOINTMENTWITH YOUR DOCTOR) 30 capsule 5  . rOPINIRole (REQUIP) 1 MG tablet Take 1 tablet (1 mg total) by mouth at bedtime. 90 tablet 1  . GLUCOSAMINE HCL PO Take 1 capsule by mouth daily.      . Multiple Vitamins-Minerals (MULTIVITAMINS THER. W/MINERALS) TABS Take 1 tablet by mouth daily.       No facility-administered medications prior to visit.     PAST MEDICAL HISTORY: Past Medical History:  Diagnosis Date  .  Asthma   . Chronic back pain greater than 3 months duration   . RLS (restless legs syndrome)   . Seasonal allergies     PAST SURGICAL HISTORY: Past Surgical History:  Procedure Laterality Date  . APPENDECTOMY    . DENTAL SURGERY      FAMILY HISTORY: Family History  Problem Relation Age of Onset  . Arthritis Mother   . Restless legs syndrome Mother   . Bladder Cancer Father   . Restless legs syndrome Father     SOCIAL HISTORY: Social History   Social History  . Marital status: Single    Spouse name: N/A  . Number of children: 2  . Years of education: college    Occupational History  . Sport and exercise psychologistsoftware engineer [employer]   Social History Main Topics  . Smoking status: Former Smoker    Packs/day: 0.20    Years: 6.00    Types: Cigarettes  . Smokeless tobacco: Never Used  . Alcohol use Yes     Comment: occasionally  . Drug use: No  . Sexual activity: Yes   Other Topics Concern  . Not on file   Social History Narrative   Lives at home w/ his wife   Right-handed   Drinks 3 cups of caffeinated beverages per day      PHYSICAL EXAM  Vitals:   05/22/16 0822  BP: 119/73  Pulse: 66  Weight: 164 lb (74.4 kg)   Body mass index is 24.94 kg/m.  Generalized: Well developed, in no acute distress   Neurological examination  Mentation: Alert oriented to time, place, history taking. Follows all commands speech and language fluent Cranial nerve II-XII: Pupils were equal round reactive to light. Extraocular movements were full, visual field were full on confrontational test. Facial sensation and strength were normal. Uvula tongue midline. Head turning and shoulder shrug  were normal and symmetric. Motor: The motor testing reveals 5 over 5 strength of all 4 extremities. Good symmetric motor tone is noted throughout.  Sensory: Sensory testing is intact to soft touch on all 4 extremities. No evidence of extinction is noted.  Coordination: Cerebellar testing reveals good finger-nose-finger and heel-to-shin bilaterally.  Gait and station: Gait is normal. Tandem gait is normal. Romberg is negative. No drift is seen.  Reflexes: Deep tendon reflexes are symmetric and normal bilaterally.   DIAGNOSTIC DATA (LABS, IMAGING, TESTING) - I reviewed patient records, labs, notes, testing and imaging myself where available.  Lab Results  Component Value Date   WBC 5.9 11/21/2015   HGB 12.9 (L) 05/29/2011   HCT 40.3 11/21/2015   MCV 84 11/21/2015   PLT 295 11/21/2015       ASSESSMENT AND PLAN 50 y.o. year old male  has a past medical history of Asthma; Chronic  back pain greater than 3 months duration; RLS (restless legs syndrome); and Seasonal allergies. here with:  1. Restless leg syndrome  At this time the patient is not interested in increasing his medication. He will remain on Requip 1 mg at bedtime and gabapentin 100 mg at bedtime. We did have a long discussion about potentially increasing gabapentin and  the side effects and mechanism of action of gabapentin. For now he can continue to try nonpharmacological methods to improve his symptoms. Advised that if he would like to increase gabapentin he should let us know. He will follow-up in 6 months or sooner if needed.     Butch PennyMegan Hooria Gasparini, MSN, NP-C 05/22/2016, 9:13 AM [facility] [address],  [place], [place] [address] [phone]

## 2016-05-22 NOTE — Progress Notes (Signed)
I have read the note, and I agree with the clinical assessment and plan.  Emelio Schneller KEITH   

## 2016-05-22 NOTE — Patient Instructions (Signed)
Continue gabapentin and requip In the future we can increase gabapentin  If your symptoms worsen or you develop new symptoms please let us know.

## 2016-05-28 ENCOUNTER — Other Ambulatory Visit: Payer: Self-pay | Admitting: Emergency Medicine

## 2016-05-28 DIAGNOSIS — R52 Pain, unspecified: Secondary | ICD-10-CM

## 2016-05-29 ENCOUNTER — Ambulatory Visit
Admission: RE | Admit: 2016-05-29 | Discharge: 2016-05-29 | Disposition: A | Discharge status: Home / Self Care | Payer: Managed Care, Other (non HMO) | Source: Ambulatory Visit | Attending: Emergency Medicine | Admitting: Emergency Medicine

## 2016-05-29 DIAGNOSIS — R52 Pain, unspecified: Secondary | ICD-10-CM

## 2016-06-04 ENCOUNTER — Other Ambulatory Visit: Payer: Self-pay | Admitting: Neurology

## 2016-11-27 ENCOUNTER — Other Ambulatory Visit: Payer: Self-pay | Admitting: Neurology

## 2016-12-18 ENCOUNTER — Encounter: Payer: Self-pay | Admitting: Adult Health

## 2016-12-18 ENCOUNTER — Ambulatory Visit (INDEPENDENT_AMBULATORY_CARE_PROVIDER_SITE_OTHER): Payer: Commercial Managed Care - PPO | Admitting: Adult Health

## 2016-12-18 VITALS — BP 105/61 | HR 75 | Wt 160.0 lb

## 2016-12-18 DIAGNOSIS — G2581 Restless legs syndrome: Secondary | ICD-10-CM

## 2016-12-18 MED ORDER — ROPINIROLE HCL 1 MG PO TABS: ORAL_TABLET | ORAL | 3 refills | Status: DC

## 2016-12-18 MED ORDER — ROPINIROLE HCL 0.5 MG PO TABS: 0.5000 mg | ORAL_TABLET | Freq: Every evening | ORAL | 3 refills | Status: DC | PRN

## 2016-12-18 NOTE — Patient Instructions (Signed)
Your Plan:  Continue Gabapentin Continue Requip 1 mg at bedtime. Can take 0.5 mg for breakthrough symptoms   [facility]. I hope we have been able to provide you high quality care today.  You may receive a patient satisfaction survey over the next few weeks. We would appreciate your feedback and comments so that we may continue to improve ourselves and the health of our patients.

## 2016-12-18 NOTE — Progress Notes (Signed)
I agree with the assessment and plan as directed by NP .The patient is known to me .   Don Tiu, MD  

## 2016-12-18 NOTE — Progress Notes (Signed)
PATIENT: [name] Ward DOB: 10/11/1965  REASON FOR VISIT: follow up- RLS HISTORY FROM: patient  HISTORY OF PRESENT ILLNESS: [name] is a 51 year old male with a history of restless leg syndrome. He returns today for follow-up. He remains on Requip 1 mg at bedtime as well as gabapentin 100 mg at bedtime. He reports that when he has breakthrough symptoms he has been taking a half a tablet of Requip and finds that beneficial. He also tries nonpharmacological measures such as doing a nightly massage with mustard oil, stretching and exercising daily. He states that when he is busy throughout the day he does not notice his symptoms. He states that when he is on a flight his symptoms seem to get slightly worse. He returns today for an evaluation.  HISTORY 05/22/16: [name] is a 51 year old male with a history of restless legs and normal. He returns today for follow-up. He is currently taking Requip 1 mg at bedtime and 100 mg of gabapentin. He reports that he continues to have symptoms. He states that the symptoms normally start as soon as he goes to bed. He usually has to get back up and move around and then try to go back to bed. He states after he increased Requip he did get temporary relief for about one month. He states that if he is exhausted or exercises before bedtime his symptoms are better. He has also been trying to massage his legs with hot oil before bedtime which he has also found beneficial. The patient states occasionally once he goes to sleep he may be woken up with symptoms however this is not nightly. The patient is not interested in increasing his medication or starting anything new today. He returns today for an evaluation.   REVIEW OF SYSTEMS: Out of a complete 14 system review of symptoms, the patient complains only of the following symptoms, and all other reviewed systems are negative.  Aching muscles, muscle cramps  ALLERGIES: No Known Allergies  HOME MEDICATIONS: Outpatient  Medications Prior to Visit  Medication Sig Dispense Refill  . albuterol (PROVENTIL HFA;VENTOLIN HFA) 108 (90 BASE) MCG/ACT inhaler Inhale 2 puffs into the lungs every 6 (six) hours as needed.      . etodolac (LODINE) 500 MG tablet Take 500 mg by mouth as needed.      . Fluticasone-Salmeterol (ADVAIR) 100-50 MCG/DOSE AEPB Inhale 1 puff into the lungs as needed.      . gabapentin (NEURONTIN) 100 MG capsule TAKE 1 CAPSULE AT BEDTIME  (PLEASE MAKE AN APPOINTMENTWITH YOUR DOCTOR) 30 capsule 5  . rOPINIRole (REQUIP) 1 MG tablet TAKE 1 TABLET AT BEDTIME   *MYLAN* 90 tablet 1  . acetaminophen (TYLENOL) 500 MG tablet Take 500-1,000 mg by mouth every 6 (six) hours as needed. For pain.      No facility-administered medications prior to visit.     PAST MEDICAL HISTORY: Past Medical History:  Diagnosis Date  . Asthma   . Chronic back pain greater than 3 months duration   . RLS (restless legs syndrome)   . Seasonal allergies     PAST SURGICAL HISTORY: Past Surgical History:  Procedure Laterality Date  . APPENDECTOMY    . DENTAL SURGERY      FAMILY HISTORY: Family History  Problem Relation Age of Onset  . Arthritis Mother   . Restless legs syndrome Mother   . Bladder Cancer Father   . Restless legs syndrome Father     SOCIAL HISTORY: Social History  Social History  . Marital status: Single    Spouse name: N/A  . Number of children: 2  . 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 Smoking status: Former Smoker    Packs/day: 0.20    Years: 6.00    Types: Cigarettes  . Smokeless tobacco: Never Used  . Alcohol use Yes     Comment: occasionally  . Drug use: No  . Sexual activity: Yes   Other Topics Concern  . Not on file   Social History Narrative   Lives at home w/ his wife   Right-handed   Drinks 3 cups of caffeinated beverages per day      PHYSICAL EXAM  Vitals:   12/18/16 0819  BP: 105/61    Pulse: 75  Weight: 160 lb (72.6 kg)   Body mass index is 24.33 kg/m.  Generalized: Well developed, in no acute distress   Neurological examination  Mentation: Alert oriented to time, place, history taking. Follows all commands speech and language fluent Cranial nerve II-XII: Pupils were equal round reactive to light. Extraocular movements were full, visual field were full on confrontational test. Facial sensation and strength were normal. Uvula tongue midline. Head turning and shoulder shrug  were normal and symmetric. Motor: The motor testing reveals 5 over 5 strength of all 4 extremities. Good symmetric motor tone is noted throughout.  Sensory: Sensory testing is intact to soft touch on all 4 extremities. No evidence of extinction is noted.  Coordination: Cerebellar testing reveals good finger-nose-finger and heel-to-shin bilaterally.  Gait and station: Gait is normal. Tandem gait is normal. Romberg is negative. No drift is seen.  Reflexes: Deep tendon reflexes are symmetric and normal bilaterally.   DIAGNOSTIC DATA (LABS, IMAGING, TESTING) - I reviewed patient records, labs, notes, testing and imaging myself where available.  Lab Results  Component Value Date   WBC 5.9 11/21/2015   HGB 13.4 11/21/2015   HCT 40.3 11/21/2015   MCV 84 11/21/2015   PLT 295 11/21/2015      Component Value Date/Time   NA 129 (L) 05/29/2011 0520   K 4.3 05/29/2011 0520   CL 97 05/29/2011 0520   CO2 22 05/29/2011 0520   GLUCOSE 97 05/29/2011 0520   BUN 7 05/29/2011 0520   CREATININE 0.99 05/29/2011 0520   CALCIUM 8.7 05/29/2011 0520   PROT 6.9 05/27/2011 1810   ALBUMIN 3.5 05/27/2011 1810   AST 248 (H) 05/27/2011 1810   ALT 225 (H) 05/27/2011 1810   ALKPHOS 89 05/27/2011 1810   BILITOT 0.3 05/27/2011 1810   GFRNONAA >90 05/29/2011 0520   GFRAA >90 05/29/2011 0520      ASSESSMENT AND PLAN 51 y.o. year old male  has a past medical history of Asthma; Chronic back pain greater than 3  months duration; RLS (restless legs syndrome); and Seasonal allergies. here with:  1. Restless leg syndrome  The patient will continue on Requip 1 mg at bedtime. I will give him Requip 0.5 mg to take nightly as needed for breakthrough symptoms. He will continue on gabapentin 100 mg daily. He is advised that if his symptoms worsen or he develops new symptoms he should let us know. He will follow-up in 6 months or sooner if needed.    Butch Penny, MSN, NP-C 12/18/2016, 8:24 AM [facility] [address], [address] [place], [place] [address] [phone]

## 2017-05-21 ENCOUNTER — Other Ambulatory Visit: Payer: Self-pay | Admitting: Adult Health

## 2017-12-21 ENCOUNTER — Ambulatory Visit: Payer: Commercial Managed Care - PPO | Admitting: Neurology

## 2018-01-01 IMAGING — MR MR SHOULDER*L* W/O CM
5 series · 34 of 40 positions shown · non-contrast
Comparison: None.

CLINICAL DATA: Left shoulder pain since exercising 6 weeks ago. No
known injury.

EXAM:
MRI OF THE LEFT SHOULDER WITHOUT CONTRAST
TECHNIQUE: Multiplanar, multisequence MR imaging of the shoulder was performed.
No intravenous contrast was administered.

[Series 3: T2 fat-sat · axial · 4.0mm · 0.55mm/px · z∈[-56,+27]mm · 8 of 20 slices shown (1 of 3)]
[im 1/20]
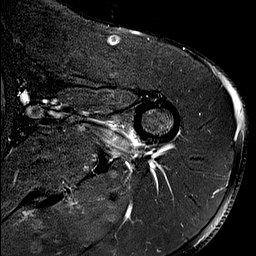
[im 3/20]
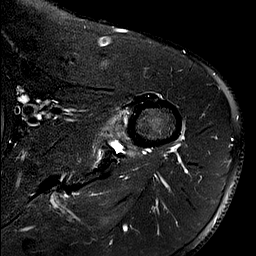
[im 6/20]
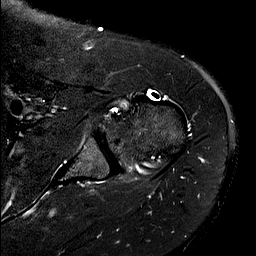
[im 9/20]
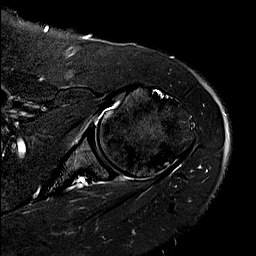
[im 11/20]
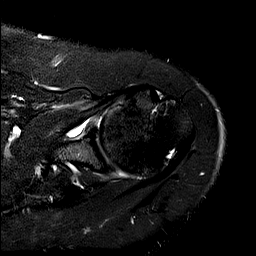
[im 14/20]
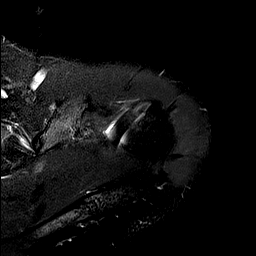
[im 17/20]
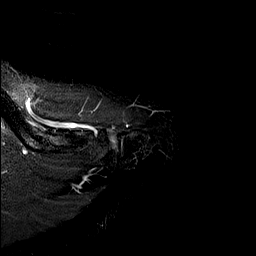
[im 20/20]
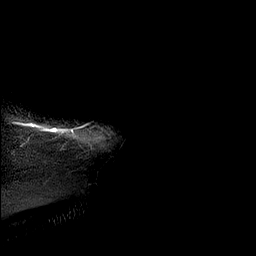

[Series 4: T2 fat-sat · oblique · 4.0mm · 0.55mm/px · 9 of 20 slices shown (2 of 3)]
[im 1/20]
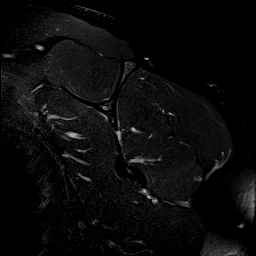
[im 3/20]
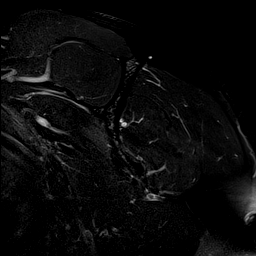
[im 5/20]
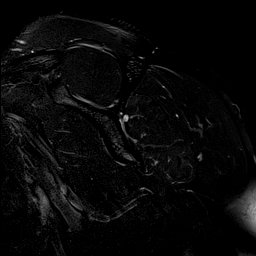
[im 8/20]
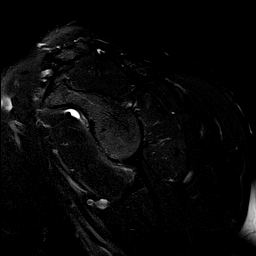
[im 10/20]
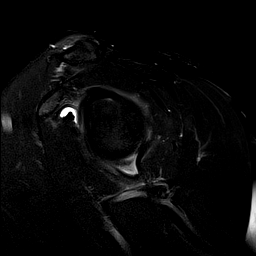
[im 12/20]
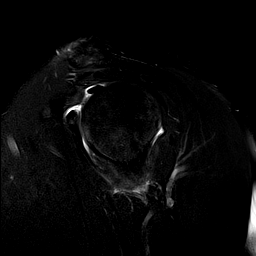
[im 15/20]
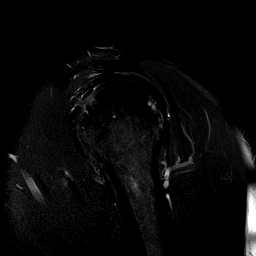
[im 17/20]
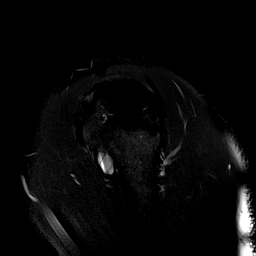
[im 20/20]
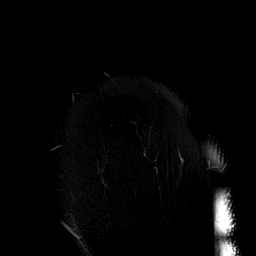

[Series 5: T1 · oblique · 4.0mm · 0.22mm/px · 3 of 20 slices shown]
[im 1/20]
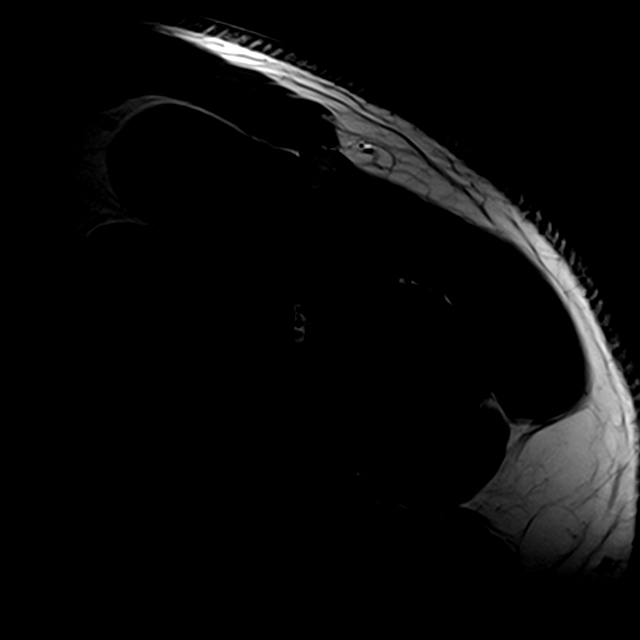
[im 3/20]
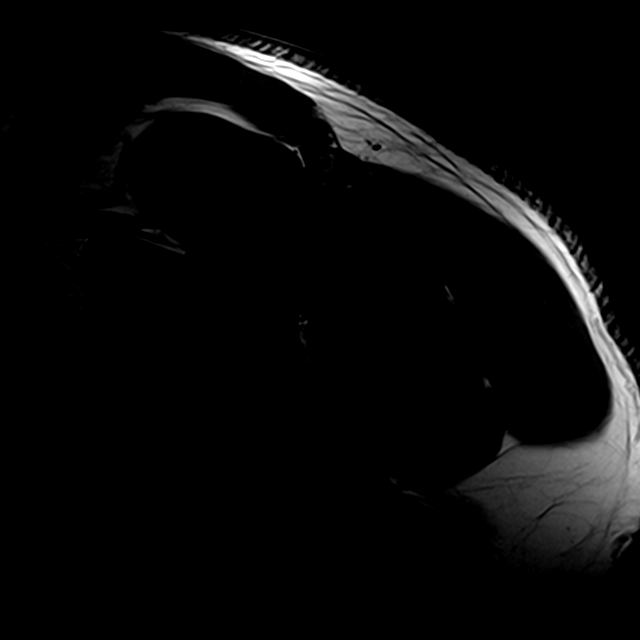
[im 5/20]
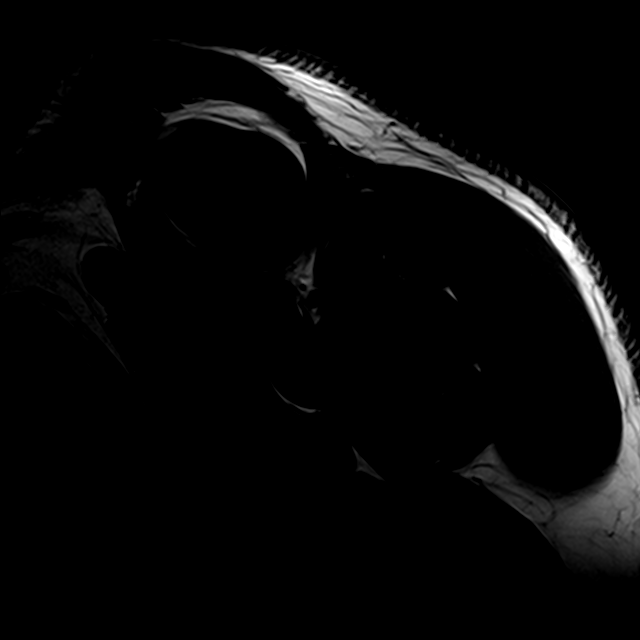

[Series 6: T2 fat-sat · coronal · 4.0mm · 0.27mm/px · 7 of 17 slices shown (3 of 3)]
[im 1/17]
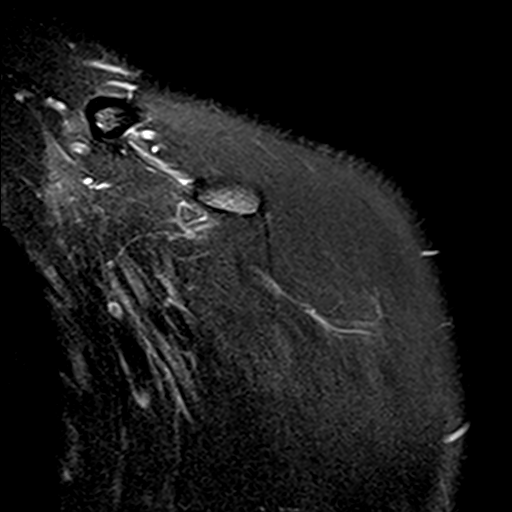
[im 3/17]
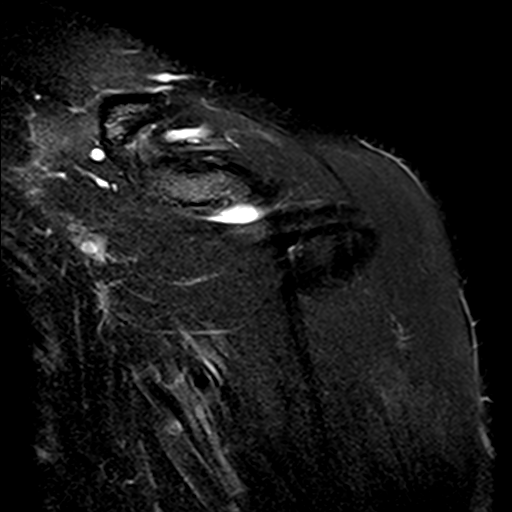
[im 6/17]
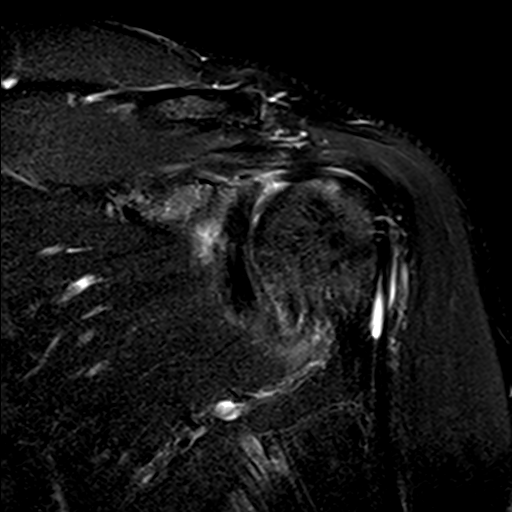
[im 9/17]
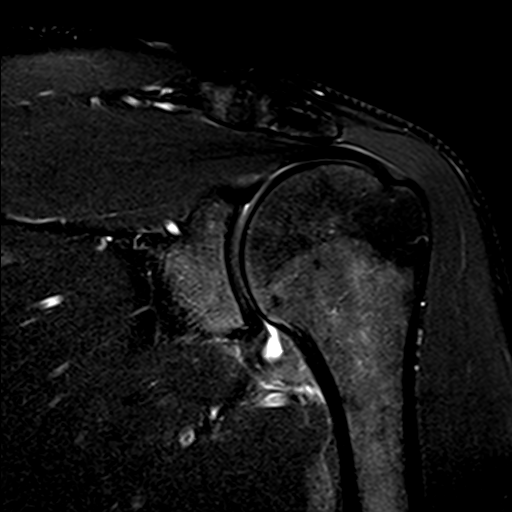
[im 11/17]
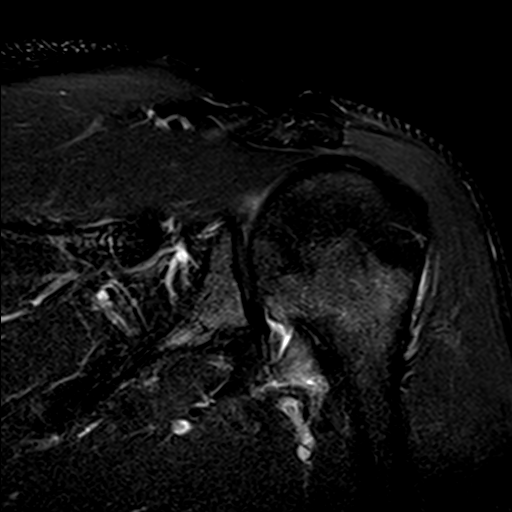
[im 14/17]
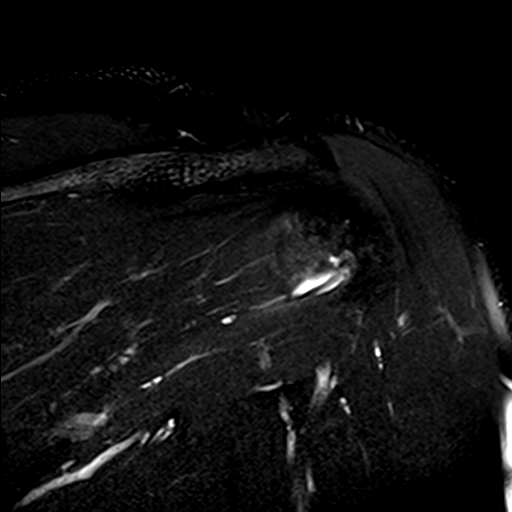
[im 17/17]
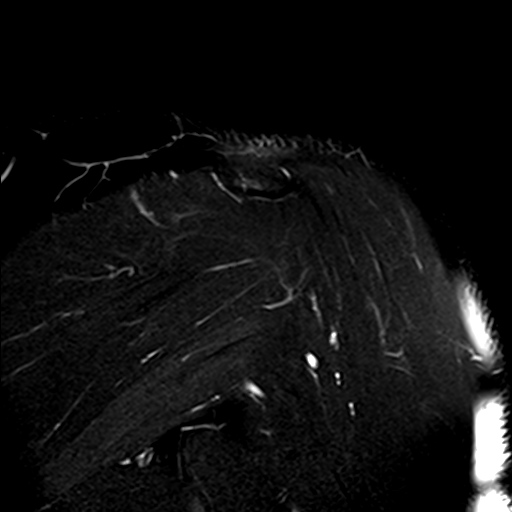

[Series 7: PD · coronal · 4.0mm · 0.44mm/px · 7 of 17 slices shown]
[im 1/17]
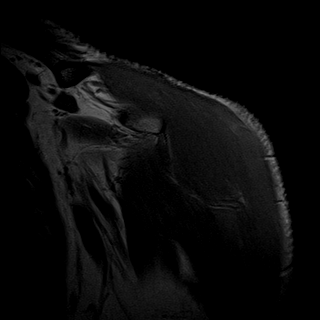
[im 3/17]
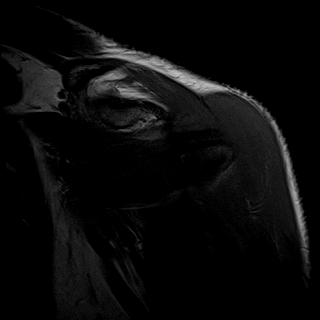
[im 6/17]
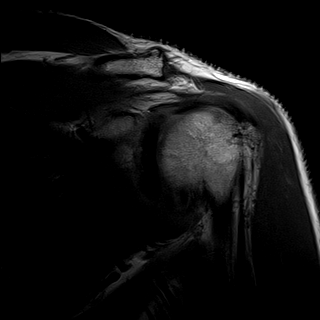
[im 9/17]
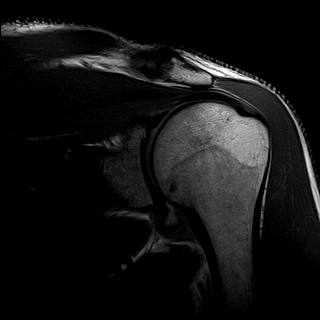
[im 11/17]
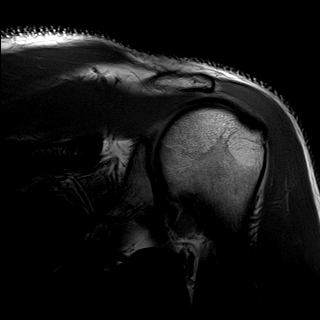
[im 14/17]
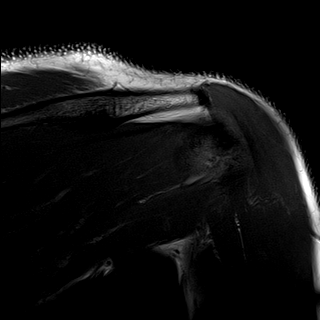
[im 17/17]
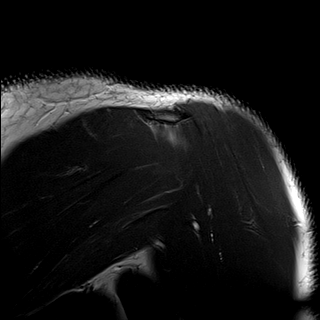

[34 of 40 positions shown; findings below may reference images not displayed]

FINDINGS: Rotator cuff: Mild appearing supraspinatus tendinopathy without tear
is identified. Otherwise unremarkable.

Muscles:  Intact and normal in appearance.

Biceps long head:  Intact and normal appearance.

Acromioclavicular Joint: Mild to moderate degenerative change is
seen. Type 2 acromion. No subacromial/subdeltoid bursal fluid.

Glenohumeral Joint: The inferior glenohumeral ligament is markedly
thickened with intermediate increased T2 signal within it consistent
with adhesive capsulitis.

Labrum:  Intact.

Bones:  No fracture or worrisome lesion.

Other: None.
IMPRESSION: Mild appearing supraspinatus tendinopathy without tear.

Findings consistent with adhesive capsulitis.

Mild to moderate acromioclavicular osteoarthritis.

## 2018-02-11 ENCOUNTER — Ambulatory Visit (INDEPENDENT_AMBULATORY_CARE_PROVIDER_SITE_OTHER): Payer: Commercial Managed Care - PPO | Admitting: Neurology

## 2018-02-11 ENCOUNTER — Encounter: Payer: Self-pay | Admitting: Neurology

## 2018-02-11 VITALS — BP 110/60 | HR 56 | Ht 68.0 in | Wt 162.0 lb

## 2018-02-11 DIAGNOSIS — G2581 Restless legs syndrome: Secondary | ICD-10-CM

## 2018-02-11 MED ORDER — GABAPENTIN 100 MG PO CAPS: 200.0000 mg | ORAL_CAPSULE | Freq: Every day | ORAL | 3 refills | Status: AC

## 2018-02-11 MED ORDER — ROPINIROLE HCL 0.5 MG PO TABS: 0.5000 mg | ORAL_TABLET | Freq: Every evening | ORAL | 3 refills | Status: AC | PRN

## 2018-02-11 NOTE — Progress Notes (Signed)
Reason for visit: Restless leg syndrome  [name] Ward is an 52 y.o. male  History of present illness:  [name] Ward is a 52 year old right-handed [place] male with a history of restless leg syndrome.  He is taking 1 to 1-1/2 mg of Requip at night, he oftentimes gets only 3 to 4 hours asleep and then wakes up and has difficulty getting back to sleep.  The patient does have restless legs during the day but he does not notice it as much when he is busy doing things.  He also takes 100 mg of gabapentin at night.  He tolerates medications well.  He has been using topical CBD oil on the legs without much benefit.  He indicates that the restless leg syndrome is his most concerning symptom that he has day to day.  On the other hand, he does not wish to go up on the medication for the restless legs.  The patient returns to the office today for an evaluation.  Past Medical History:  Diagnosis Date  . Asthma   . Chronic back pain greater than 3 months duration   . RLS (restless legs syndrome)   . Seasonal allergies     Past Surgical History:  Procedure Laterality Date  . APPENDECTOMY    . DENTAL SURGERY      Family History  Problem Relation Age of Onset  . Arthritis Mother   . Restless legs syndrome Mother   . Bladder Cancer Father   . Restless legs syndrome Father     Social history:  reports that he has quit smoking. His smoking use included cigarettes. He has a 1.20 pack-year smoking history. He has never used smokeless tobacco. He reports that he drinks alcohol. He reports that he does not use drugs.   No Known Allergies  Medications:  Prior to Admission medications   Medication Sig Start Date End Date Taking? Authorizing Provider  acetaminophen (TYLENOL) 500 MG tablet Take 500-1,000 mg by mouth every 6 (six) hours as needed. For pain.    Yes [provider]  albuterol (PROVENTIL HFA;VENTOLIN HFA) 108 (90 BASE) MCG/ACT inhaler Inhale 2 puffs into the lungs every 6 (six) hours  as needed.     Yes [provider]  etodolac (LODINE) 500 MG tablet Take 500 mg by mouth as needed.     Yes [provider]  Fluticasone-Salmeterol (ADVAIR) 100-50 MCG/DOSE AEPB Inhale 1 puff into the lungs as needed.     Yes [provider]  gabapentin (NEURONTIN) 100 MG capsule Take 2 capsules (200 mg total) by mouth at bedtime. 02/11/18  Yes York Spaniel, MD  montelukast (SINGULAIR) 10 MG tablet Take 10 mg by mouth as needed.   Yes [provider]  rOPINIRole (REQUIP) 0.5 MG tablet Take 1 tablet (0.5 mg total) by mouth at bedtime as needed. 02/11/18  Yes York Spaniel, MD  rOPINIRole (REQUIP) 1 MG tablet TAKE 1 TABLET BY MOUTH AT BEDTIME 05/21/17  Yes Dohmeier, Porfirio Mylar, MD    ROS:  Out of a complete 14 system review of symptoms, the patient complains only of the following symptoms, and all other reviewed systems are negative.  Restless legs Aching muscles, muscle cramps  Blood pressure 110/60, pulse (!) 56, height 5\' 8"  (1.727 m), weight 162 lb (73.5 kg), SpO2 98 %.  Physical Exam  General: The patient is alert and cooperative at the time of the examination.  Skin: No significant peripheral edema is noted.   Neurologic Exam  Mental status: The patient is alert and oriented x 3 at the time of the examination. The patient has apparent normal recent and remote memory, with an apparently normal attention span and concentration ability.   Cranial nerves: Facial symmetry is present. Speech is normal, no aphasia or dysarthria is noted. Extraocular movements are full. Visual fields are full.  Motor: The patient has good strength in all 4 extremities.  Sensory examination: Soft touch sensation is symmetric on the face, arms, and legs.  Coordination: The patient has good finger-nose-finger and heel-to-shin bilaterally.  Gait and station: The patient has a normal gait. Tandem gait is normal. Romberg is negative. No drift is seen.  Reflexes:  Deep tendon reflexes are symmetric.   Assessment/Plan:  1.  Restless leg syndrome  The patient has continued to have ongoing symptoms, he does not want to go up on the Requip dose.  We will go up on the gabapentin dose to help the restless legs, the patient was reassured that the gabapentin is a very safe drug.  He will begin taking 200 mg of gabapentin at night, he will call for any dose adjustments.  A prescription was sent in for the gabapentin and for the 0.5 mg Requip tablets.  He will follow-up in 1 year, sooner if needed.  Greater than 50% of the visit was spent in counseling and coordination of care.  Face-to-face time with the patient was 25 minutes.   Marlan Palau. Keith Willis MD 02/11/2018 12:54 PM  [facility] [address] [place], [place] [id number]  Phone [phone] Fax [phone]

## 2018-05-08 ENCOUNTER — Other Ambulatory Visit: Payer: Self-pay | Admitting: Neurology

## 2018-09-13 DIAGNOSIS — J988 Other specified respiratory disorders: Secondary | ICD-10-CM | POA: Diagnosis not present

## 2018-09-13 DIAGNOSIS — J45909 Unspecified asthma, uncomplicated: Secondary | ICD-10-CM | POA: Diagnosis not present

## 2018-10-26 DIAGNOSIS — T24291A Burn of second degree of multiple sites of right lower limb, except ankle and foot, initial encounter: Secondary | ICD-10-CM | POA: Diagnosis not present

## 2018-10-29 DIAGNOSIS — T300 Burn of unspecified body region, unspecified degree: Secondary | ICD-10-CM | POA: Diagnosis not present

## 2019-01-24 ENCOUNTER — Telehealth: Payer: Self-pay

## 2019-01-24 NOTE — Telephone Encounter (Signed)
Pt wife called wanting to schedule NP appt with Juleen China. I informed her Juleen China was not accepting anymore np at this time. PTs wife stated she had spoke with wallace and that Juleen China stated she would see pt. Please advise.

## 2019-01-24 NOTE — Telephone Encounter (Signed)
Yes.  Dr. Juleen China did say that it would be ok.  Please make appointment.

## 2019-02-09 ENCOUNTER — Ambulatory Visit
Admission: RE | Admit: 2019-02-09 | Discharge: 2019-02-09 | Disposition: A | Discharge status: Home / Self Care | Payer: Managed Care, Other (non HMO) | Source: Ambulatory Visit | Attending: Family Medicine | Admitting: Family Medicine

## 2019-02-09 ENCOUNTER — Other Ambulatory Visit: Payer: Self-pay | Admitting: Family Medicine

## 2019-02-09 DIAGNOSIS — R509 Fever, unspecified: Secondary | ICD-10-CM

## 2019-02-16 ENCOUNTER — Ambulatory Visit: Payer: Commercial Managed Care - PPO | Admitting: Adult Health

## 2019-02-17 ENCOUNTER — Telehealth: Payer: Self-pay

## 2019-02-17 NOTE — Telephone Encounter (Signed)
Copied from [place] [id number]. Topic: Appointment Scheduling - Scheduling Inquiry for Clinic >> Feb 17, 2019 11:39 AM Alanda Slim E wrote: Reason for CRM: Pt has shingles and wants to know if he will need to cancel or reschedule his CPE apt that is for next wed/ please advise

## 2019-02-17 NOTE — Telephone Encounter (Signed)
Called patient it was for new patient. He did not want to schedule with another provider at this time would like to c/a and if he would like to see someone here he will let our office know.

## 2019-02-21 ENCOUNTER — Telehealth: Payer: Self-pay | Admitting: Physical Therapy

## 2019-02-21 NOTE — Telephone Encounter (Signed)
Copied from [place] [id number]. Topic: Quick Communication - See Telephone Encounter >> Feb 21, 2019  4:44 PM Loma Boston wrote: CRM for notification. See Telephone encounter for: 02/21/19. Pt states that she needs to know if Dr Juleen China received pt records as if she is leaving the practice that they may not wish to change dr. Please call pt and clarify [phone]

## 2019-02-23 ENCOUNTER — Ambulatory Visit: Payer: Managed Care, Other (non HMO) | Admitting: Family Medicine

## 2019-02-25 NOTE — Telephone Encounter (Signed)
Called and spoke with patient. He said that he does not want to transfer care since Dr. Juleen China is leaving. I advised that we still have Dr. Orland Mustard listed as PCP in our system. Advised that he contact Dr. Darien Ramus office to confirm that he is still listed as PCP in their system as well. Pt verbalized understanding.

## 2019-05-23 ENCOUNTER — Other Ambulatory Visit: Payer: Self-pay | Admitting: Neurology

## 2019-08-22 ENCOUNTER — Ambulatory Visit: Payer: Commercial Managed Care - PPO | Admitting: Adult Health

## 2020-03-05 ENCOUNTER — Other Ambulatory Visit: Payer: Self-pay | Admitting: Otolaryngology

## 2020-03-05 DIAGNOSIS — R42 Dizziness and giddiness: Secondary | ICD-10-CM

## 2020-03-05 DIAGNOSIS — H9012 Conductive hearing loss, unilateral, left ear, with unrestricted hearing on the contralateral side: Secondary | ICD-10-CM

## 2020-03-07 ENCOUNTER — Other Ambulatory Visit: Payer: Self-pay | Admitting: Otolaryngology

## 2020-03-14 ENCOUNTER — Ambulatory Visit
Admission: RE | Admit: 2020-03-14 | Discharge: 2020-03-14 | Disposition: A | Discharge status: Home / Self Care | Payer: Commercial Managed Care - PPO | Source: Ambulatory Visit | Attending: Otolaryngology | Admitting: Otolaryngology

## 2020-03-14 ENCOUNTER — Other Ambulatory Visit: Payer: Self-pay

## 2020-03-14 DIAGNOSIS — R42 Dizziness and giddiness: Secondary | ICD-10-CM

## 2020-03-14 DIAGNOSIS — H9012 Conductive hearing loss, unilateral, left ear, with unrestricted hearing on the contralateral side: Secondary | ICD-10-CM

## 2020-04-26 ENCOUNTER — Ambulatory Visit: Payer: Commercial Managed Care - PPO | Attending: Otolaryngology

## 2020-04-26 ENCOUNTER — Other Ambulatory Visit: Payer: Self-pay

## 2020-04-26 DIAGNOSIS — R42 Dizziness and giddiness: Secondary | ICD-10-CM | POA: Insufficient documentation

## 2020-04-26 DIAGNOSIS — R2681 Unsteadiness on feet: Secondary | ICD-10-CM | POA: Diagnosis present

## 2020-04-26 DIAGNOSIS — R2689 Other abnormalities of gait and mobility: Secondary | ICD-10-CM | POA: Diagnosis present

## 2020-04-26 NOTE — Therapy (Signed)
[facility] [address] [place], [place], [address] Phone: [phone]   Fax:  [phone]  Physical Therapy Evaluation  Patient Details  Name: [name] Ward MRN: [id number] Date of Birth: Oct 26, 1965 Referring Provider (PT): Dr. Suszanne Connerseoh   Encounter Date: 04/26/2020   PT End of Session - 04/26/20 1321    Visit Number 1    Number of Visits 5    Date for PT Re-Evaluation 06/25/20    Authorization Type UMR    PT Start Time 1155   pt arrived late   PT Stop Time 1245    PT Time Calculation (min) 50 min    Equipment Utilized During Treatment Other (comment)   min guard to S prn   Activity Tolerance Patient tolerated treatment well    Behavior During Therapy [facility] for tasks assessed/performed           Past Medical History:  Diagnosis Date  . Asthma   . Chronic back pain greater than 3 months duration   . RLS (restless legs syndrome)   . Seasonal allergies     Past Surgical History:  Procedure Laterality Date  . APPENDECTOMY    . DENTAL SURGERY      There were no vitals filed for this visit.    Subjective Assessment - 04/26/20 1205    Subjective Pt reported dizziness began approx. 2 years ago in the fall time with cold weather. He took Zyrtec and it got better for awhile. Pt reported he had a R root canal around the same time and he had an episode of dizziness. Prednisone helped. It occurs with each fall season. Pt works in [employer] and sits all day for his job. He feels imbalanced when amb. outside. EKG was normal. Blood test revealed low iron-did a supplement. Pt's MD told pt to drink gatorade to improve dizziness (incr. salt) and it improved. He had one episode at night (in July) when he awoke and turned to the L and experienced vertigo (room was spinning), worse with movement. MD prescribed Meclizine and it helped a little bit. Dr. 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 Pt also has infection in L upper jaw per imaging and pt (dentist following). Pt more concerned with balance but it is improving (walks every morning)-mainly in evening. No dizziness at this time. He does neck exercises every morning to improve posture.    Pertinent History RLS, low BP, asthma, chronic LBP    Patient Stated Goals Learn techniques to improve dizziness and balance during walking.    Currently in Pain? Yes    Pain Score 3     Pain Location Back    Pain Orientation Left;Lower    Pain Descriptors / Indicators Aching    Pain Type Chronic pain    Pain Onset More than a month ago    Pain Frequency Intermittent    Aggravating Factors  sitting for prolonged hours    Pain Relieving Factors exercises and stretching              [facility] PT Assessment - 04/26/20 1220      Assessment   Medical Diagnosis Dizziness    Referring Provider (PT) Dr. Suszanne Connerseoh    Onset Date/Surgical Date --   about two years ago   Hand Dominance Right    Prior Therapy none for dizziness      Precautions  Precautions Fall      Restrictions   Weight Bearing Restrictions No      Balance Screen   Has the patient fallen in the past 6 months No    Has the patient had a decrease in activity level because of a fear of falling?  No    Is the patient reluctant to leave their home because of a fear of falling?  No      Home Nurse, mental health Private residence    Living Arrangements Children;Spouse/significant other    Available Help at Discharge Family    Type of Home House    Home Access Stairs to enter    Entrance Stairs-Number of Steps 3-4    Entrance Stairs-Rails Can reach both    Home Layout Two level;Able to live on main level with bedroom/bathroom    Alternate Level Stairs-Number of Steps 15    Alternate Level Stairs-Rails Right    Home Equipment None      Prior Function   Level of  Independence Independent    Vocation Full time employment    Vocation Requirements IT support, seated all day    Leisure Yoga, watch TV, walk outside, hang out with family and friends       Cognition   Overall Cognitive Status Within Functional Limits for tasks assessed      Sensation   Additional Comments Pt denied N/T      Ambulation/Gait   Ambulation/Gait Yes    Ambulation/Gait Assistance 7: Independent    Ambulation Distance (Feet) 150 Feet    Assistive device None    Gait Pattern Within Functional Limits;Step-through pattern    Ambulation Surface Level;Indoor    Gait velocity 3.23ft/sec. no AD      Functional Gait  Assessment   Gait assessed  Yes    Gait Level Surface Walks 20 ft in less than 7 sec but greater than 5.5 sec, uses assistive device, slower speed, mild gait deviations, or deviates 6-10 in outside of the 12 in walkway width.   5.97sec.   Change in Gait Speed Able to smoothly change walking speed without loss of balance or gait deviation. Deviate no more than 6 in outside of the 12 in walkway width.    Gait with Horizontal Head Turns Performs head turns smoothly with slight change in gait velocity (eg, minor disruption to smooth gait path), deviates 6-10 in outside 12 in walkway width, or uses an assistive device.    Gait with Vertical Head Turns Performs task with slight change in gait velocity (eg, minor disruption to smooth gait path), deviates 6 - 10 in outside 12 in walkway width or uses assistive device    Gait and Pivot Turn Pivot turns safely in greater than 3 sec and stops with no loss of balance, or pivot turns safely within 3 sec and stops with mild imbalance, requires small steps to catch balance.    Step Over Obstacle Is able to step over 2 stacked shoe boxes taped together (9 in total height) without changing gait speed. No evidence of imbalance.    Gait with Narrow Base of Support Is able to ambulate for 10 steps heel to toe with no staggering.    Gait with  Eyes Closed Walks 20 ft, uses assistive device, slower speed, mild gait deviations, deviates 6-10 in outside 12 in walkway width. Ambulates 20 ft in less than 9 sec but greater than 7 sec.    Ambulating Backwards Walks 20 ft,  uses assistive device, slower speed, mild gait deviations, deviates 6-10 in outside 12 in walkway width.    Steps Alternating feet, no rail.    Total Score 24    FGA comment: 24/30: moderate falls risk.                  Vestibular Assessment - 04/26/20 1227      Vestibular Assessment   General Observation Pt stated RLS makes it challenging for pt to sleep well. Pt denied falls, accident but reported decr. L sided hearing.      Symptom Behavior   Subjective history of current problem see assessment    Type of Dizziness  Imbalance   with one vertigo episode   Frequency of Dizziness Imbalance at night vs. dizziness.    Duration of Dizziness just while walking    Symptom Nature Intermittent    Aggravating Factors Sit to stand   walking    Relieving Factors Rest    Progression of Symptoms Better      Oculomotor Exam   Oculomotor Alignment Normal    Spontaneous Absent    Gaze-induced  Absent    Smooth Pursuits Intact    Saccades Undershoots   during L side   Comment Convergence: WNL      Oculomotor Exam-Fixation Suppressed    Left Head Impulse negative    Right Head Impulse negative      Vestibulo-Ocular Reflex   VOR 1 Head Only (x 1 viewing) Decr. speed and pt reported fatigue but no dizziness.               Objective measurements completed on examination: See above findings.               PT Education - 04/26/20 1320    Education Details PT educated pt on PT frequency, duration, and POC. PT discussed outcome measure results. Pt stated he's only able to come to PT once a week 2/2 work and leaves for [place] on 06/03/20.    Person(s) Educated Patient    Methods Explanation    Comprehension Verbalized understanding            PT  Short Term Goals - 04/26/20 1330      PT SHORT TERM GOAL #1   Title Same as LTGs             PT Long Term Goals - 04/26/20 1330      PT LONG TERM GOAL #1   Title Pt will be IND in HEP to improve balance and decr. dizziness. TARGET DATE FOR ALL LTGS: 05/24/20    Time 4    Period Weeks    Status New      PT LONG TERM GOAL #2   Title Pt will improve FGA to >/=29/30 to decr. falls risk.    Baseline 24/30    Time 4    Period Weeks    Status New      PT LONG TERM GOAL #3   Title Pt will amb. 1000' over even and uneven terrain, IND, while performing head turns/nods without LOB to improve safety during functional mobility.    Time 4    Period Weeks    Status New      PT LONG TERM GOAL #4   Title Finish vestibular assessment write goals as indicated.    Time 4    Period Weeks    Status New  Plan - 04/26/20 1323    Clinical Impression Statement Pt is a pleasant 54y/o male presenting to [facility] neuro for dizziness. Pt's PMH is significant for the following: RLS, low BP, asthma, chronic LBP. Per Dr. 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 Pt late today, so testing limited, PT will complete vestibular assessment. 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 Pt's imbalance likely multifactorial based on hx and vestibular exam. Pt's FGA score indicated pt is at moderate falls risk. Pt would benefit from skilled PT to improve safety during functional mobility.    Personal Factors and Comorbidities Comorbidity 3+;Time since onset of injury/illness/exacerbation;Other   only able to come to PT 1x/week and then traveling   Comorbidities RLS, low BP, asthma, chronic LBP. Per Dr. 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 and L conductive  hearing loss    Examination-Activity Limitations Bed Mobility;Bend;Locomotion Level;Transfers;Stand;Caring for Others    Examination-Participation Restrictions Occupation;Other;Interpersonal Relationship   playing soccer   Stability/Clinical Decision Making Evolving/Moderate complexity    Clinical Decision Making Moderate    Rehab Potential Good    PT Frequency 1x / week    PT Duration 4 weeks   pt unable to come to PT 2x/week 2/2 work   PT Treatment/Interventions ADLs/Self Care Home Management;Biofeedback;Canalith Repostioning;DME Instruction;Balance training;Therapeutic exercise;Therapeutic activities;Functional mobility training;Stair training;Gait training;Patient/family education;Vestibular    PT Next Visit Plan Complete vestibular exam (SVA/DVA, positional testing, motion sensitivity, orthostatics). Provide pt with x1 viewing and balance HEP (vestibular and balance focused)    Consulted and Agree with Plan of Care Patient           Patient will benefit from skilled therapeutic intervention in order to improve the following deficits and impairments:  Abnormal gait, Dizziness, Decreased mobility, Decreased balance, Decreased knowledge of use of DME  Visit Diagnosis: Dizziness and giddiness - Plan: PT plan of care cert/re-cert  Other abnormalities of gait and mobility - Plan: PT plan of care cert/re-cert  Unsteadiness on feet - Plan: PT plan of care cert/re-cert     Problem List Patient Active Problem List   Diagnosis Date Noted  . Restless legs syndrome (RLS) 09/30/2013  . Diarrhea 05/24/2011  . Hyponatremia 05/24/2011  . Dehydration 05/24/2011    Lynette Noah L 04/26/2020, 1:34 PM  [facility] [address] [place], [place], [address] Phone: [phone]   Fax:  [phone]  Name: [name] Ward MRN: [id number] Date of Birth: 1966/01/08   Zerita Boers, PT,DPT 04/26/20 1:34 PM Phone:  [phone] Fax: [phone]

## 2020-05-03 ENCOUNTER — Other Ambulatory Visit: Payer: Self-pay

## 2020-05-03 ENCOUNTER — Ambulatory Visit: Payer: Commercial Managed Care - PPO

## 2020-05-03 DIAGNOSIS — R2681 Unsteadiness on feet: Secondary | ICD-10-CM

## 2020-05-03 DIAGNOSIS — R42 Dizziness and giddiness: Secondary | ICD-10-CM

## 2020-05-03 NOTE — Therapy (Signed)
[facility] [address] [place], [place], [address] Phone: [phone]   Fax:  [phone]  Physical Therapy Treatment  Patient Details  Name: [name] Ward MRN: [id number] Date of Birth: 06-01-1966 Referring Provider (PT): Dr. Suszanne Conners   Encounter Date: 05/03/2020   PT End of Session - 05/03/20 1338    Visit Number 2    Number of Visits 5    Date for PT Re-Evaluation 06/25/20    Authorization Type UMR    PT Start Time 1154   pt arrived late   PT Stop Time 1235    PT Time Calculation (min) 41 min    Equipment Utilized During Treatment Other (comment)   S prn   Activity Tolerance Patient tolerated treatment well    Behavior During Therapy [facility] for tasks assessed/performed           Past Medical History:  Diagnosis Date  . Asthma   . Chronic back pain greater than 3 months duration   . RLS (restless legs syndrome)   . Seasonal allergies     Past Surgical History:  Procedure Laterality Date  . APPENDECTOMY    . DENTAL SURGERY      There were no vitals filed for this visit.   Subjective Assessment - 05/03/20 1155    Subjective Pt denied falls or changes since last visit.    Pertinent History RLS, low BP, asthma, chronic LBP    Patient Stated Goals Learn techniques to improve dizziness and balance during walking.    Currently in Pain? No/denies                   Vestibular Assessment - 05/03/20 1156      Visual Acuity   Static 11    Dynamic 8      Positional Testing   Dix-Hallpike Dix-Hallpike Right;Dix-Hallpike Left    Horizontal Canal Testing Horizontal Canal Right;Horizontal Canal Left      Dix-Hallpike Right   Dix-Hallpike Right Duration 0    Dix-Hallpike Right Symptoms No nystagmus      Dix-Hallpike Left   Dix-Hallpike Left Duration 0    Dix-Hallpike Left Symptoms No nystagmus      Horizontal Canal Right   Horizontal Canal Right Duration 0    Horizontal Canal Right Symptoms  Normal      Horizontal Canal Left   Horizontal Canal Left Duration 0    Horizontal Canal Left Symptoms Normal      Positional Sensitivities   Up from Right Hallpike Lightheadedness    Up from Left Hallpike Lightheadedness      Orthostatics   BP supine (x 5 minutes) 105/62   pt denied dizziness in supine   HR supine (x 5 minutes) 62    BP sitting 111/66   pt reported lightheadedness upon sitting upright   HR sitting 65    BP standing (after 1 minute) 107/60   no dizziness reported   HR standing (after 1 minute) 68          NMR: Gaze Stabilization: Tip Card  1.Target must remain in focus, not blurry, and appear stationary while head is in motion. 2.Perform exercises with small head movements (45 to either side of midline). 3.Increase speed of head motion so long as target is in focus. 4.If you wear eyeglasses, be sure you can see target through lens (therapist will give specific instructions for bifocal / progressive lenses). 5.These exercises may provoke dizziness or nausea. Work through these symptoms.  If too dizzy, slow head movement slightly. Rest between each exercise. 6.Exercises demand concentration; avoid distractions. 7.For safety, perform standing exercises close to a counter, wall, corner, or next to someone.  Copyright  VHI. All rights reserved.    Gaze Stabilization: Sitting    Keeping eyes on target on wall  3-5  feet away, tilt head down 15-30 and move head side to side for __30__ seconds. Repeat while moving head up and down for __30__ seconds. Do __2-3__ sessions per day.  Copyright  VHI. All rights reserved.    Feet Together (Compliant Surface) Head Motion - Eyes OPEN    Stand on compliant surface: __pillows or cushion______ with feet together. Close eyes and move head slowly, up and down 10 times in each direction. Repeat __3__ times per session. Do __1-2__ sessions per day.  Copyright  VHI. All rights reserved.   Feet Together (Compliant  Surface) Varied Arm Positions - Eyes Closed    Stand on compliant surface: ___pillows and cushion_____ with feet together and arms at your side. Close eyes and visualize upright position. Hold__30__ seconds. Repeat __2-3__ times per session. Do __1__ sessions per day.  Copyright  VHI. All rights reserved.    Cues and demo for proper technique. Provided as HEP, performed in corner with S for safety.  All activities performed over compliant and non-compliant surfaces with feet together and apart, EC and EO x30 seconds and head turns/nods x10 reps/direction.        Vestibular Treatment/Exercise - 05/03/20 1214      Vestibular Treatment/Exercise   Vestibular Treatment Provided Gaze    Gaze Exercises X1 Viewing Horizontal;X1 Viewing Vertical      X1 Viewing Horizontal   Foot Position seated    Time --   30 sec.    Reps 3    Comments Cues and demo for technique.       X1 Viewing Vertical   Foot Position seated    Time --   30 sec.    Reps 3    Comments cues and demo for technique.                  PT Education - 05/03/20 1336    Education Details PT educated pt on exam findings and x1 viewing/corner balance activities to improve vestibular input.    Person(s) Educated Patient    Methods Explanation;Demonstration;Verbal cues;Handout    Comprehension Returned demonstration;Verbalized understanding            PT Short Term Goals - 04/26/20 1330      PT SHORT TERM GOAL #1   Title Same as LTGs             PT Long Term Goals - 05/03/20 1342      PT LONG TERM GOAL #1   Title Pt will be IND in HEP to improve balance and decr. dizziness. TARGET DATE FOR ALL LTGS: 05/24/20    Time 4    Period Weeks    Status New      PT LONG TERM GOAL #2   Title Pt will improve FGA to >/=29/30 to decr. falls risk.    Baseline 24/30    Time 4    Period Weeks    Status New      PT LONG TERM GOAL #3   Title Pt will amb. 1000' over even and uneven terrain, IND, while  performing head turns/nods without LOB to improve safety during functional mobility.    Time 4  Period Weeks    Status New      PT LONG TERM GOAL #4   Title Finish vestibular assessment write goals as indicated.    Time 4    Period Weeks    Status Achieved      PT LONG TERM GOAL #5   Title Pt will perform SVA vs. DVA testing with <2 line difference to improve wooziness during functional mobility.    Baseline 3    Time 4    Period Weeks    Status New                 Plan - 05/03/20 1339    Clinical Impression Statement Exam finding negative for s/s of positional vertigo. Pt's line difference during SVA vs. DVA was right at cut off of being significant at 3 lines with concordant wooziness reported. Orthostatic testing was negative for siginficant decr. in BP, however, pt reported concordant wooziness during supine to sit, therefore PT instructed pt on safety and waiting 30-60 sec. prior to standing and performing B shoulder flexion to improve vitals. PT provided pt with corner balance HEP to improve vestibular input, with pt experiencing incr. postural sway during activities which required incr. vestibular input. Pt would continue to benefit from skilled PT to improve safety during functional mobility.    Personal Factors and Comorbidities Comorbidity 3+;Time since onset of injury/illness/exacerbation;Other   only able to come to PT 1x/week and then traveling   Comorbidities RLS, low BP, asthma, chronic LBP. Per Dr. 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    Examination-Activity Limitations Bed Mobility;Bend;Locomotion Level;Transfers;Stand;Caring for Others    Examination-Participation Restrictions Occupation;Other;Interpersonal Relationship   playing soccer   Stability/Clinical Decision Making Evolving/Moderate complexity    Rehab Potential Good    PT  Frequency 1x / week    PT Duration 4 weeks   pt unable to come to PT 2x/week 2/2 work   PT Treatment/Interventions ADLs/Self Care Home Management;Biofeedback;Canalith Repostioning;DME Instruction;Balance training;Therapeutic exercise;Therapeutic activities;Functional mobility training;Stair training;Gait training;Patient/family education;Vestibular    PT Next Visit Plan High level balance/gait with vestibular emphasis. Review HEP as needed.    PT Home Exercise Plan x1 viewing and corner balance HEP    Consulted and Agree with Plan of Care Patient           Patient will benefit from skilled therapeutic intervention in order to improve the following deficits and impairments:  Abnormal gait, Dizziness, Decreased mobility, Decreased balance, Decreased knowledge of use of DME  Visit Diagnosis: Dizziness and giddiness  Unsteadiness on feet     Problem List Patient Active Problem List   Diagnosis Date Noted  . Restless legs syndrome (RLS) 09/30/2013  . Diarrhea 05/24/2011  . Hyponatremia 05/24/2011  . Dehydration 05/24/2011    Ciarah Peace L 05/03/2020, 1:43 PM  [facility] [address] [place], [place], [address] Phone: [phone]   Fax:  [phone]  Name: [name] Ward MRN: [id number] Date of Birth: 04/28/1966  Zerita Boers, PT,DPT 05/03/20 1:45 PM Phone: [phone] Fax: [phone]

## 2020-05-03 NOTE — Patient Instructions (Addendum)
Gaze Stabilization: Tip Card  1.Target must remain in focus, not blurry, and appear stationary while head is in motion. 2.Perform exercises with small head movements (45 to either side of midline). 3.Increase speed of head motion so long as target is in focus. 4.If you wear eyeglasses, be sure you can see target through lens (therapist will give specific instructions for bifocal / progressive lenses). 5.These exercises may provoke dizziness or nausea. Work through these symptoms. If too dizzy, slow head movement slightly. Rest between each exercise. 6.Exercises demand concentration; avoid distractions. 7.For safety, perform standing exercises close to a counter, wall, corner, or next to someone.  Copyright  VHI. All rights reserved.    Gaze Stabilization: Sitting    Keeping eyes on target on wall  3-5  feet away, tilt head down 15-30 and move head side to side for __30__ seconds. Repeat while moving head up and down for __30__ seconds. Do __2-3__ sessions per day.  Copyright  VHI. All rights reserved.    Feet Together (Compliant Surface) Head Motion - Eyes OPEN    Stand on compliant surface: __pillows or cushion______ with feet together. Close eyes and move head slowly, up and down 10 times in each direction. Repeat __3__ times per session. Do __1-2__ sessions per day.  Copyright  VHI. All rights reserved.   Feet Together (Compliant Surface) Varied Arm Positions - Eyes Closed    Stand on compliant surface: ___pillows and cushion_____ with feet together and arms at your side. Close eyes and visualize upright position. Hold__30__ seconds. Repeat __2-3__ times per session. Do __1__ sessions per day.  Copyright  VHI. All rights reserved.

## 2020-05-10 ENCOUNTER — Ambulatory Visit: Payer: Commercial Managed Care - PPO

## 2020-05-10 ENCOUNTER — Other Ambulatory Visit: Payer: Self-pay

## 2020-05-10 DIAGNOSIS — R42 Dizziness and giddiness: Secondary | ICD-10-CM | POA: Diagnosis not present

## 2020-05-10 DIAGNOSIS — R2689 Other abnormalities of gait and mobility: Secondary | ICD-10-CM

## 2020-05-10 DIAGNOSIS — R2681 Unsteadiness on feet: Secondary | ICD-10-CM

## 2020-05-10 NOTE — Patient Instructions (Addendum)
Gaze Stabilization: Tip Card  1.Target must remain in focus, not blurry, and appear stationary while head is in motion. 2.Perform exercises with small head movements (45 to either side of midline). 3.Increase speed of head motion so long as target is in focus. 4.If you wear eyeglasses, be sure you can see target through lens (therapist will give specific instructions for bifocal / progressive lenses). 5.These exercises may provoke dizziness or nausea. Work through these symptoms. If too dizzy, slow head movement slightly. Rest between each exercise. 6.Exercises demand concentration; avoid distractions. 7.For safety, perform standing exercises close to a counter, wall, corner, or next to someone.  Copyright  VHI. All rights reserved.    Gaze Stabilization: Sitting    Keeping eyes on target on wall  3-5  feet away, tilt head down 15-30 and move head side to side for __30__ seconds. Repeat while moving head up and down for __30__ seconds. Do __2-3__ sessions per day.  Copyright  VHI. All rights reserved.   **Perform in standing on pillows and feet together.    Feet Together (Compliant Surface) Head Motion - Eyes CLOSED    Stand on compliant surface: __pillows or cushion______ with feet together. Close eyes and move head slowly, up and down 10 times in each direction. Repeat __3__ times per session. Do __1-2__ sessions per day.  Copyright  VHI. All rights reserved.   Feet Together (Compliant Surface) Varied Arm Positions - Eyes Closed    Stand on compliant surface: ___pillows and cushion_____ with feet together and arms at your side. Close eyes and visualize upright position. Hold__30__ seconds. Repeat __2-3__ times per session. Do __1__ sessions per day.  Copyright  VHI. All rights reserved.  Cues and demo for proper technique. Provided as HEP, performed in corner with S for safety.  All activities performed over compliant and non-compliant surfaces.

## 2020-05-10 NOTE — Therapy (Signed)
[facility] [address] [place], [place], [address] Phone: [phone]   Fax:  [phone]  Physical Therapy Treatment  Patient Details  Name: [name] Ward MRN: [id number] Date of Birth: [birth year] Referring Provider (PT): Dr. Suszanne Conners   Encounter Date: 05/10/2020   PT End of Session - 05/10/20 1440    Visit Number 3    Number of Visits 5    Date for PT Re-Evaluation 06/25/20    Authorization Type UMR    PT Start Time 1147    PT Stop Time 1227    PT Time Calculation (min) 40 min    Equipment Utilized During Treatment Gait belt    Activity Tolerance Patient tolerated treatment well    Behavior During Therapy WFL for tasks assessed/performed           Past Medical History:  Diagnosis Date   Asthma    Chronic back pain greater than 3 months duration    RLS (restless legs syndrome)    Seasonal allergies     Past Surgical History:  Procedure Laterality Date   APPENDECTOMY     DENTAL SURGERY      There were no vitals filed for this visit.   Subjective Assessment - 05/10/20 1150    Subjective Pt denied falls or changes. Pt reported he's been performing HEP. Admin at check in marked chills as s/s but pt stated that was an error.    Pertinent History RLS, low BP, asthma, chronic LBP    Patient Stated Goals Learn techniques to improve dizziness and balance during walking.    Currently in Pain? No/denies                              Vestibular Treatment/Exercise - 05/10/20 1439      Vestibular Treatment/Exercise   Vestibular Treatment Provided Gaze    Gaze Exercises X1 Viewing Horizontal;X1 Viewing Vertical      X1 Viewing Horizontal   Foot Position seated and standing    Time --   30 sec.    Reps 3    Comments Pt performed with feet apart, on and off pillows. Cues for technique.      X1 Viewing Vertical   Foot Position seated and standing    Time --   30 sec.    Reps 3     Comments Pt performed with feet apart, on and off pillows. Cues for technique.              Balance Exercises - 05/10/20 1203      Balance Exercises: Standing   Standing Eyes Opened Narrow base of support (BOS);Wide (BOA);Head turns;Foam/compliant surface;3 reps;30 secs;Other reps (comment);10 secs   10 reps   Standing Eyes Closed Narrow base of support (BOS);Wide (BOA);Foam/compliant surface;3 reps;30 secs;Other reps (comment)   10 reps   Rockerboard Anterior/posterior;Lateral;Head turns;EO;EC;10 seconds;10 reps;Other reps (comment)   3 reps   Balance Beam Tandem gait on foam beam in // bars 4x10' and sidestepping while toe tapping cones and knocking cones and uprighting cones 4x10'activity. Min guard to S for safety and cues/demo for technique.     Other Standing Exercises Performed in corner or // bars with min guard to S for safety. See pt HEP for details.              PT Education - 05/10/20 1439    Education Details PT reviewed and discussed HEP.  PT Short Term Goals - 04/26/20 1330      PT SHORT TERM GOAL #1   Title Same as LTGs             PT Long Term Goals - 05/03/20 1342      PT LONG TERM GOAL #1   Title Pt will be IND in HEP to improve balance and decr. dizziness. TARGET DATE FOR ALL LTGS: 05/24/20    Time 4    Period Weeks    Status New      PT LONG TERM GOAL #2   Title Pt will improve FGA to >/=29/30 to decr. falls risk.    Baseline 24/30    Time 4    Period Weeks    Status New      PT LONG TERM GOAL #3   Title Pt will amb. 1000' over even and uneven terrain, IND, while performing head turns/nods without LOB to improve safety during functional mobility.    Time 4    Period Weeks    Status New      PT LONG TERM GOAL #4   Title Finish vestibular assessment write goals as indicated.    Time 4    Period Weeks    Status Achieved      PT LONG TERM GOAL #5   Title Pt will perform SVA vs. DVA testing with <2 line difference to improve  wooziness during functional mobility.    Baseline 3    Time 4    Period Weeks    Status New                 Plan - 05/10/20 1440    Clinical Impression Statement Pt demonstrated progress, as he was able to tolerate progression of corner balance and x1 viewing HEP without provoking dizziness. Pt continues to experience incr. postural sway during activities that require incr. vestibular input. Pt would continue to benefit from skilled PT to improve safety during functional mobility.    Personal Factors and Comorbidities Comorbidity 3+;Time since onset of injury/illness/exacerbation;Other   only able to come to PT 1x/week and then traveling   Comorbidities RLS, low BP, asthma, chronic LBP. Per Dr. 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    Examination-Activity Limitations Bed Mobility;Bend;Locomotion Level;Transfers;Stand;Caring for Others    Examination-Participation Restrictions Occupation;Other;Interpersonal Relationship   playing soccer   Stability/Clinical Decision Making Evolving/Moderate complexity    Rehab Potential Good    PT Frequency 1x / week    PT Duration 4 weeks   pt unable to come to PT 2x/week 2/2 work   PT Treatment/Interventions ADLs/Self Care Home Management;Biofeedback;Canalith Repostioning;DME Instruction;Balance training;Therapeutic exercise;Therapeutic activities;Functional mobility training;Stair training;Gait training;Patient/family education;Vestibular    PT Next Visit Plan Conitnue High level balance/gait with vestibular emphasis. Review HEP as needed.    PT Home Exercise Plan x1 viewing and corner balance HEP    Consulted and Agree with Plan of Care Patient           Patient will benefit from skilled therapeutic intervention in order to improve the following deficits and impairments:  Abnormal gait, Dizziness, Decreased mobility,  Decreased balance, Decreased knowledge of use of DME  Visit Diagnosis: Dizziness and giddiness  Unsteadiness on feet  Other abnormalities of gait and mobility     Problem List Patient Active Problem List   Diagnosis Date Noted   Restless legs syndrome (RLS) 09/30/2013  Diarrhea 05/24/2011   Hyponatremia 05/24/2011   Dehydration 05/24/2011    Iyanni Hepp L 05/10/2020, 2:42 PM  [facility] [address] [place], [place], [address] Phone: [phone]   Fax:  [phone]  Name: [name] Ward MRN: [id number] Date of Birth: 1965/08/21  Zerita Boers, PT,DPT 05/10/20 2:42 PM Phone: [phone] Fax: [phone]

## 2020-05-24 ENCOUNTER — Ambulatory Visit: Payer: Commercial Managed Care - PPO | Attending: Otolaryngology

## 2020-05-24 DIAGNOSIS — R42 Dizziness and giddiness: Secondary | ICD-10-CM | POA: Insufficient documentation

## 2020-05-24 DIAGNOSIS — R2689 Other abnormalities of gait and mobility: Secondary | ICD-10-CM | POA: Insufficient documentation

## 2020-05-24 DIAGNOSIS — R2681 Unsteadiness on feet: Secondary | ICD-10-CM | POA: Insufficient documentation

## 2020-05-31 ENCOUNTER — Ambulatory Visit: Payer: Commercial Managed Care - PPO

## 2020-05-31 ENCOUNTER — Other Ambulatory Visit: Payer: Self-pay

## 2020-05-31 DIAGNOSIS — R2681 Unsteadiness on feet: Secondary | ICD-10-CM

## 2020-05-31 DIAGNOSIS — R2689 Other abnormalities of gait and mobility: Secondary | ICD-10-CM | POA: Diagnosis present

## 2020-05-31 DIAGNOSIS — R42 Dizziness and giddiness: Secondary | ICD-10-CM

## 2020-05-31 NOTE — Therapy (Signed)
[facility] [address] [place], [place], [address] Phone: [phone]   Fax:  [phone]  Physical Therapy Treatment  Patient Details  Name: [name] Ward MRN: [id number] Date of Birth: 22-Dec-1965 Referring Provider (PT): Dr. Benjamine Mola   Encounter Date: 05/31/2020   PT End of Session - 05/31/20 1705    Visit Number 4    Number of Visits 5    Date for PT Re-Evaluation 06/25/20    Authorization Type UMR    PT Start Time 1529   pt arrived late   PT Stop Time 1600    PT Time Calculation (min) 31 min    Equipment Utilized During Treatment Other (comment)   min guard to S prn   Activity Tolerance Patient tolerated treatment well    Behavior During Therapy [facility] for tasks assessed/performed           Past Medical History:  Diagnosis Date  . Asthma   . Chronic back pain greater than 3 months duration   . RLS (restless legs syndrome)   . Seasonal allergies     Past Surgical History:  Procedure Laterality Date  . APPENDECTOMY    . DENTAL SURGERY      There were no vitals filed for this visit.   Subjective Assessment - 05/31/20 1627    Subjective Pt denied falls or changes since last visit. Pt reported he hasn't been doing HEP a lot, as he feels better.    Pertinent History RLS, low BP, asthma, chronic LBP    Patient Stated Goals Learn techniques to improve dizziness and balance during walking.    Currently in Pain? Yes    Pain Score 2     Pain Location Back    Pain Orientation Left;Lower    Pain Descriptors / Indicators Aching    Pain Type Chronic pain    Pain Onset More than a month ago    Pain Frequency Intermittent    Aggravating Factors  sitting for prolonged hours    Pain Relieving Factors exercises and stretching              [facility] PT Assessment - 05/31/20 1628      Functional Gait  Assessment   Gait assessed  Yes    Gait Level Surface Walks 20 ft in less than 5.5 sec, no assistive devices, good  speed, no evidence for imbalance, normal gait pattern, deviates no more than 6 in outside of the 12 in walkway width.    Change in Gait Speed Able to smoothly change walking speed without loss of balance or gait deviation. Deviate no more than 6 in outside of the 12 in walkway width.    Gait with Horizontal Head Turns Performs head turns smoothly with no change in gait. Deviates no more than 6 in outside 12 in walkway width    Gait with Vertical Head Turns Performs head turns with no change in gait. Deviates no more than 6 in outside 12 in walkway width.    Gait and Pivot Turn Pivot turns safely within 3 sec and stops quickly with no loss of balance.    Step Over Obstacle Is able to step over 2 stacked shoe boxes taped together (9 in total height) without changing gait speed. No evidence of imbalance.    Gait with Narrow Base of Support Is able to ambulate for 10 steps heel to toe with no staggering.    Gait with Eyes Closed Walks 20 ft, no assistive  devices, good speed, no evidence of imbalance, normal gait pattern, deviates no more than 6 in outside 12 in walkway width. Ambulates 20 ft in less than 7 sec.    Ambulating Backwards Walks 20 ft, no assistive devices, good speed, no evidence for imbalance, normal gait    Steps Alternating feet, no rail.    Total Score 30    FGA comment: 30/30: WNL               Vestibular Assessment - 05/31/20 1637      Visual Acuity   Static 9    Dynamic 4           NMR: Gaze Stabilization: Tip Card  1.Target must remain in focus, not blurry, and appear stationary while head is in motion. 2.Perform exercises with small head movements (45 to either side of midline). 3.Increase speed of head motion so long as target is in focus. 4.If you wear eyeglasses, be sure you can see target through lens (therapist will give specific instructions for bifocal / progressive lenses). 5.These exercises may provoke dizziness or nausea. Work through these symptoms.  If too dizzy, slow head movement slightly. Rest between each exercise. 6.Exercises demand concentration; avoid distractions. 7.For safety, perform standing exercises close to a counter, wall, corner, or next to someone.  Copyright  VHI. All rights reserved.   Gaze Stabilization: Standing Feet Together (Compliant Surface)    Keeping eyes on target on wall 3-5 feet away, tilt head down 15-30 and move head side to side for __30__ seconds. Repeat while moving head up and down for __30__ seconds. Do __2-3__ sessions per day.  Copyright  VHI. All rights reserved.     Feet Together (Compliant Surface) Head Motion - Eyes CLOSED    Stand on compliant surface: __pillows or cushion______ with feet together. Close eyes and move head slowly, up and down 10 times in each direction. Repeat __3__ times per session. Do __1-2__ sessions per day.  Copyright  VHI. All rights reserved.  Feet Together (Compliant Surface) Varied Arm Positions - Eyes Closed    Stand on compliant surface: ___pillows and cushion_____ with feet together and arms at your side. Close eyes and visualize upright position. Hold__30__ seconds. Repeat __2-3__ times per session. Do __1__ sessions per day.  Copyright  VHI. All rights reserved. Cues and demo for proper technique. Provided as HEP, performed in corner and // bars with S for safety.  All activities performed over compliant and non-compliant surfaces.         [facility] Adult PT Treatment/Exercise - 05/31/20 1638      Ambulation/Gait   Ambulation/Gait Yes    Ambulation/Gait Assistance 7: Independent    Ambulation Distance (Feet) 1000 Feet    Assistive device None    Gait Pattern Within Functional Limits;Step-through pattern    Ambulation Surface Level;Unlevel;Indoor;Outdoor;Paved;Grass;Other (comment)   red and blue mats   Gait Comments Amb. while performing head turns/nods over even and unevern surfaces.                  PT  Education - 05/31/20 1703    Education Details PT educated pt on outcome measures and goal progress and d/c. Pt is happy with current level of functional and is ready for d/c and is leaving for [place] on 06/03/20. PT educated pt on the difference between VOR activities and corner balance activities.    Person(s) Educated Patient    Methods Explanation;Demonstration;Verbal cues;Handout    Comprehension Verbalized understanding;Returned demonstration  PT Short Term Goals - 04/26/20 1330      PT SHORT TERM GOAL #1   Title Same as LTGs             PT Long Term Goals - 05/31/20 1706      PT LONG TERM GOAL #1   Title Pt will be IND in HEP to improve balance and decr. dizziness. TARGET DATE FOR ALL LTGS: 05/24/20    Baseline not performing x1 viewing    Time 4    Period Weeks    Status Partially Met      PT LONG TERM GOAL #2   Title Pt will improve FGA to >/=29/30 to decr. falls risk.    Baseline 30/30    Time 4    Period Weeks    Status Achieved      PT LONG TERM GOAL #3   Title Pt will amb. 1000' over even and uneven terrain, IND, while performing head turns/nods without LOB to improve safety during functional mobility.    Time 4    Period Weeks    Status Achieved      PT LONG TERM GOAL #4   Title Finish vestibular assessment write goals as indicated.    Time 4    Period Weeks    Status Achieved      PT LONG TERM GOAL #5   Title Pt will perform SVA vs. DVA testing with <2 line difference to improve wooziness during functional mobility.    Baseline 3    Time 4    Period Weeks    Status Not Met                 Plan - 05/31/20 1705    Clinical Impression Statement Pt met LTGs 2 and 3. Pt partially met LTG 1 (HEP) as he had only been performing corner balance activities and not x1 viewing.Please see d/c summary for details.    Personal Factors and Comorbidities Comorbidity 3+;Time since onset of injury/illness/exacerbation;Other   only able to come to  PT 1x/week and then traveling   Comorbidities RLS, low BP, asthma, chronic LBP. Per Dr. 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 loss    Examination-Activity Limitations Bed Mobility;Bend;Locomotion Level;Transfers;Stand;Caring for Others    Examination-Participation Restrictions Occupation;Other;Interpersonal Relationship   playing soccer   Stability/Clinical Decision Making Evolving/Moderate complexity    Rehab Potential Good    PT Frequency 1x / week    PT Duration 4 weeks   pt unable to come to PT 2x/week 2/2 work   PT Treatment/Interventions ADLs/Self Care Home Management;Biofeedback;Canalith Repostioning;DME Instruction;Balance training;Therapeutic exercise;Therapeutic activities;Functional mobility training;Stair training;Gait training;Patient/family education;Vestibular    PT Home Exercise Plan x1 viewing and corner balance HEP    Consulted and Agree with Plan of Care Patient           Patient will benefit from skilled therapeutic intervention in order to improve the following deficits and impairments:  Abnormal gait,Dizziness,Decreased mobility,Decreased balance,Decreased knowledge of use of DME  Visit Diagnosis: Dizziness and giddiness  Other abnormalities of gait and mobility  Unsteadiness on feet     Problem List Patient Active Problem List   Diagnosis Date Noted  . Restless legs syndrome (RLS) 09/30/2013  . Diarrhea 05/24/2011  . Hyponatremia 05/24/2011  . Dehydration 05/24/2011    Miller,Jennifer L 05/31/2020, 5:08 PM  [facility] [address]  [place], [place], [address] Phone: [phone]   Fax:  [phone]  Name: [name] Ward MRN: [id number] Date of Birth: July 01, 1965  PHYSICAL THERAPY DISCHARGE SUMMARY  Visits from Start of Care: 4  Current functional level related to  goals / functional outcomes:  PT Long Term Goals - 05/31/20 1706      PT LONG TERM GOAL #1   Title Pt will be IND in HEP to improve balance and decr. dizziness. TARGET DATE FOR ALL LTGS: 05/24/20    Baseline not performing x1 viewing    Time 4    Period Weeks    Status Partially Met      PT LONG TERM GOAL #2   Title Pt will improve FGA to >/=29/30 to decr. falls risk.    Baseline 30/30    Time 4    Period Weeks    Status Achieved      PT LONG TERM GOAL #3   Title Pt will amb. 1000' over even and uneven terrain, IND, while performing head turns/nods without LOB to improve safety during functional mobility.    Time 4    Period Weeks    Status Achieved      PT LONG TERM GOAL #4   Title Finish vestibular assessment write goals as indicated.    Time 4    Period Weeks    Status Achieved      PT LONG TERM GOAL #5   Title Pt will perform SVA vs. DVA testing with <2 line difference to improve wooziness during functional mobility.    Baseline 3    Time 4    Period Weeks    Status Not Met             Remaining deficits: Significant line difference during static visual acuity and dynamic 2/2 pt not performing x1 viewing HEP.    Education / Equipment: HEP  Plan: Patient agrees to discharge.  Patient goals were met. Patient is being discharged due to being pleased with the current functional level.  ?????       Geoffry Paradise, PT,DPT 05/31/20 5:09 PM Phone: [phone] Fax: [phone]

## 2020-05-31 NOTE — Patient Instructions (Addendum)
Gaze Stabilization: Tip Card  1.Target must remain in focus, not blurry, and appear stationary while head is in motion. 2.Perform exercises with small head movements (45 to either side of midline). 3.Increase speed of head motion so long as target is in focus. 4.If you wear eyeglasses, be sure you can see target through lens (therapist will give specific instructions for bifocal / progressive lenses). 5.These exercises may provoke dizziness or nausea. Work through these symptoms. If too dizzy, slow head movement slightly. Rest between each exercise. 6.Exercises demand concentration; avoid distractions. 7.For safety, perform standing exercises close to a counter, wall, corner, or next to someone.  Copyright  VHI. All rights reserved.   Gaze Stabilization: Standing Feet Together (Compliant Surface)    Keeping eyes on target on wall 3-5 feet away, tilt head down 15-30 and move head side to side for __30__ seconds. Repeat while moving head up and down for __30__ seconds. Do __2-3__ sessions per day.  Copyright  VHI. All rights reserved.     Feet Together (Compliant Surface) Head Motion - Eyes CLOSED    Stand on compliant surface: __pillows or cushion______ with feet together. Close eyes and move head slowly, up and down 10 times in each direction. Repeat __3__ times per session. Do __1-2__ sessions per day.  Copyright  VHI. All rights reserved.  Feet Together (Compliant Surface) Varied Arm Positions - Eyes Closed    Stand on compliant surface: ___pillows and cushion_____ with feet together and arms at your side. Close eyes and visualize upright position. Hold__30__ seconds. Repeat __2-3__ times per session. Do __1__ sessions per day.  Copyright  VHI. All rights reserved. Cues and demo for proper technique. Provided as HEP, performed in corner with S for safety.  All activities performed over complia nt and non-compliant surfaces.

## 2020-09-13 IMAGING — DX CHEST - 2 VIEW
2 series · 2 of 2 positions shown · non-contrast
Comparison: 09/18/2015

CLINICAL DATA: Fever, cough, shortness of breath

EXAM:
CHEST - 2 VIEW

[dg chest 2 view (1 of 2)]
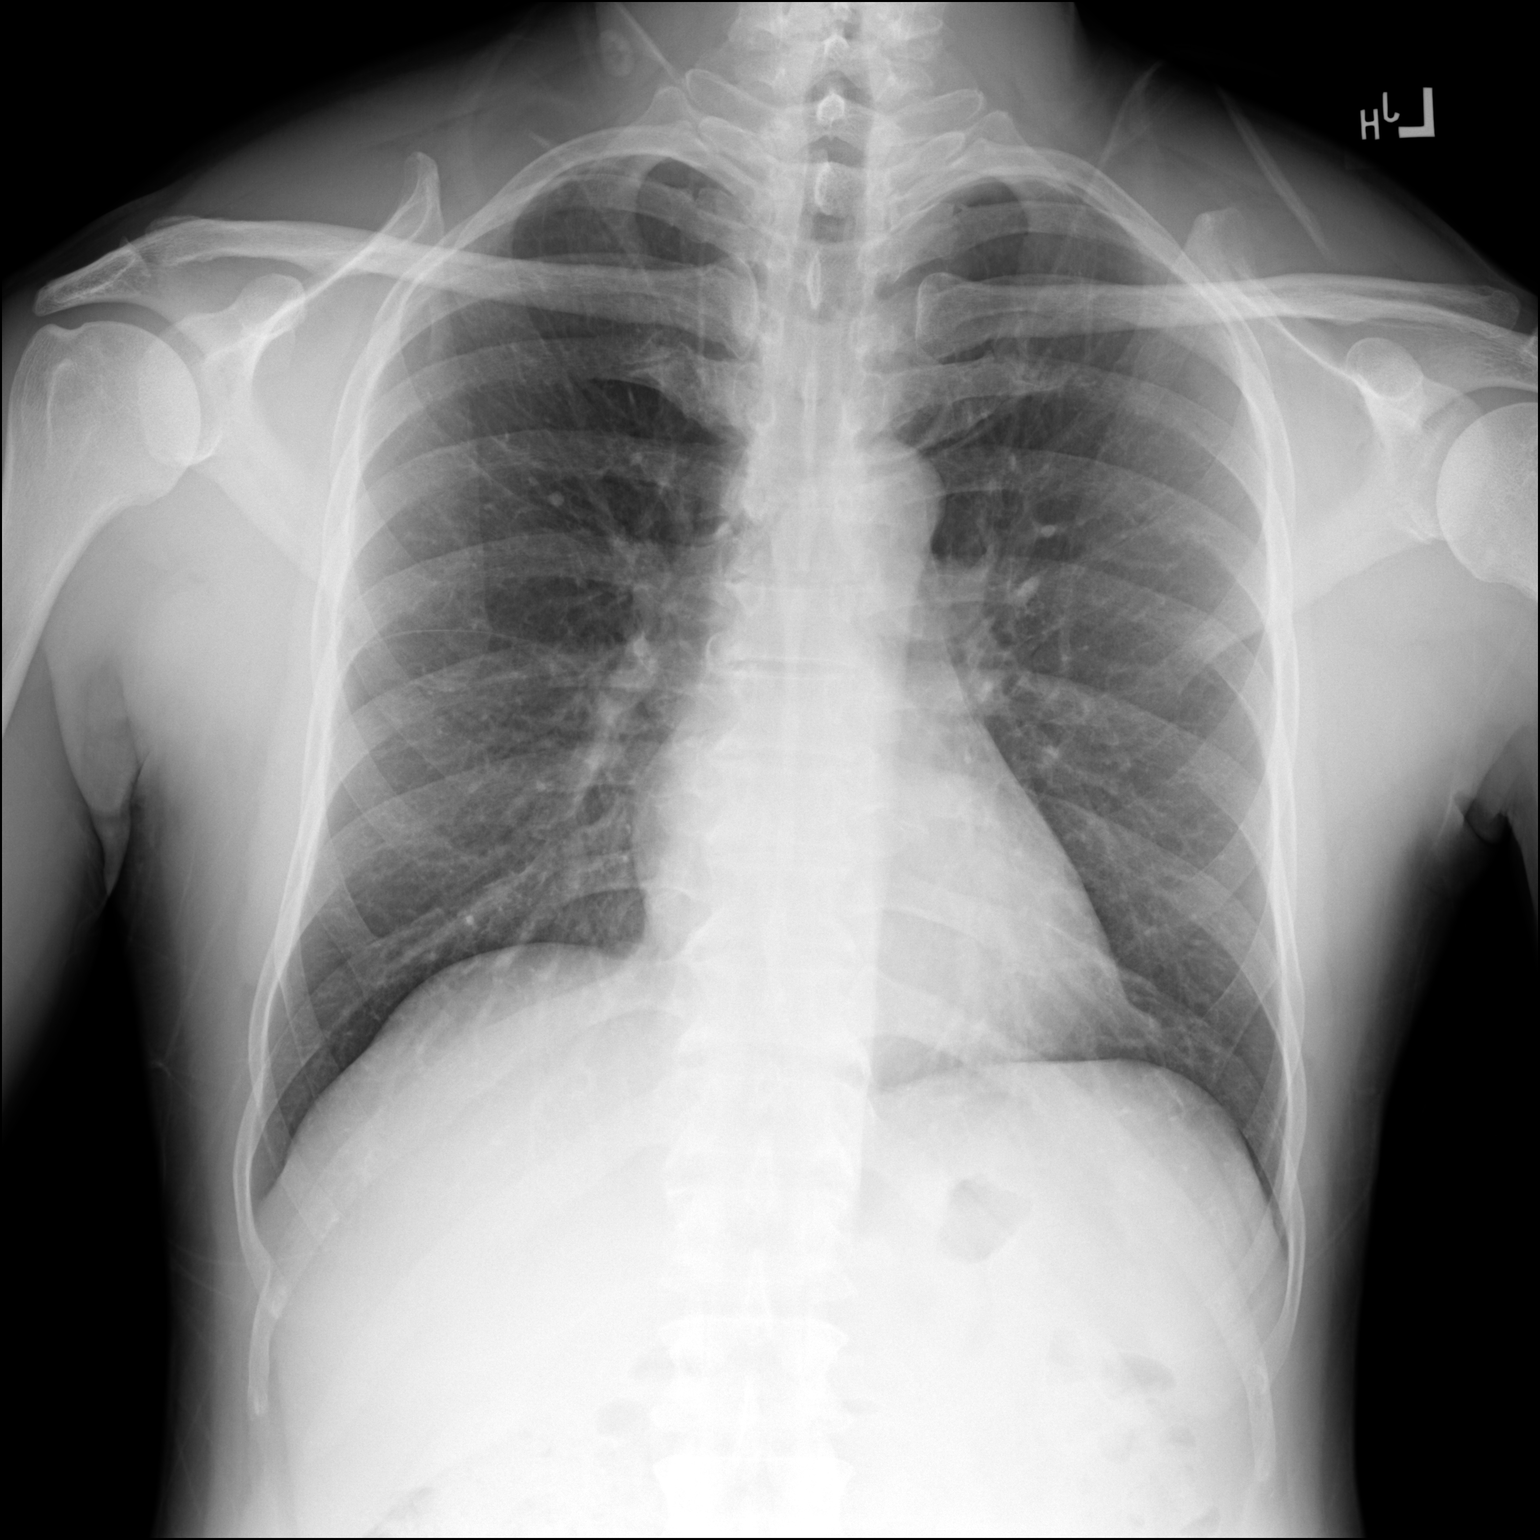

[dg chest 2 view (2 of 2)]
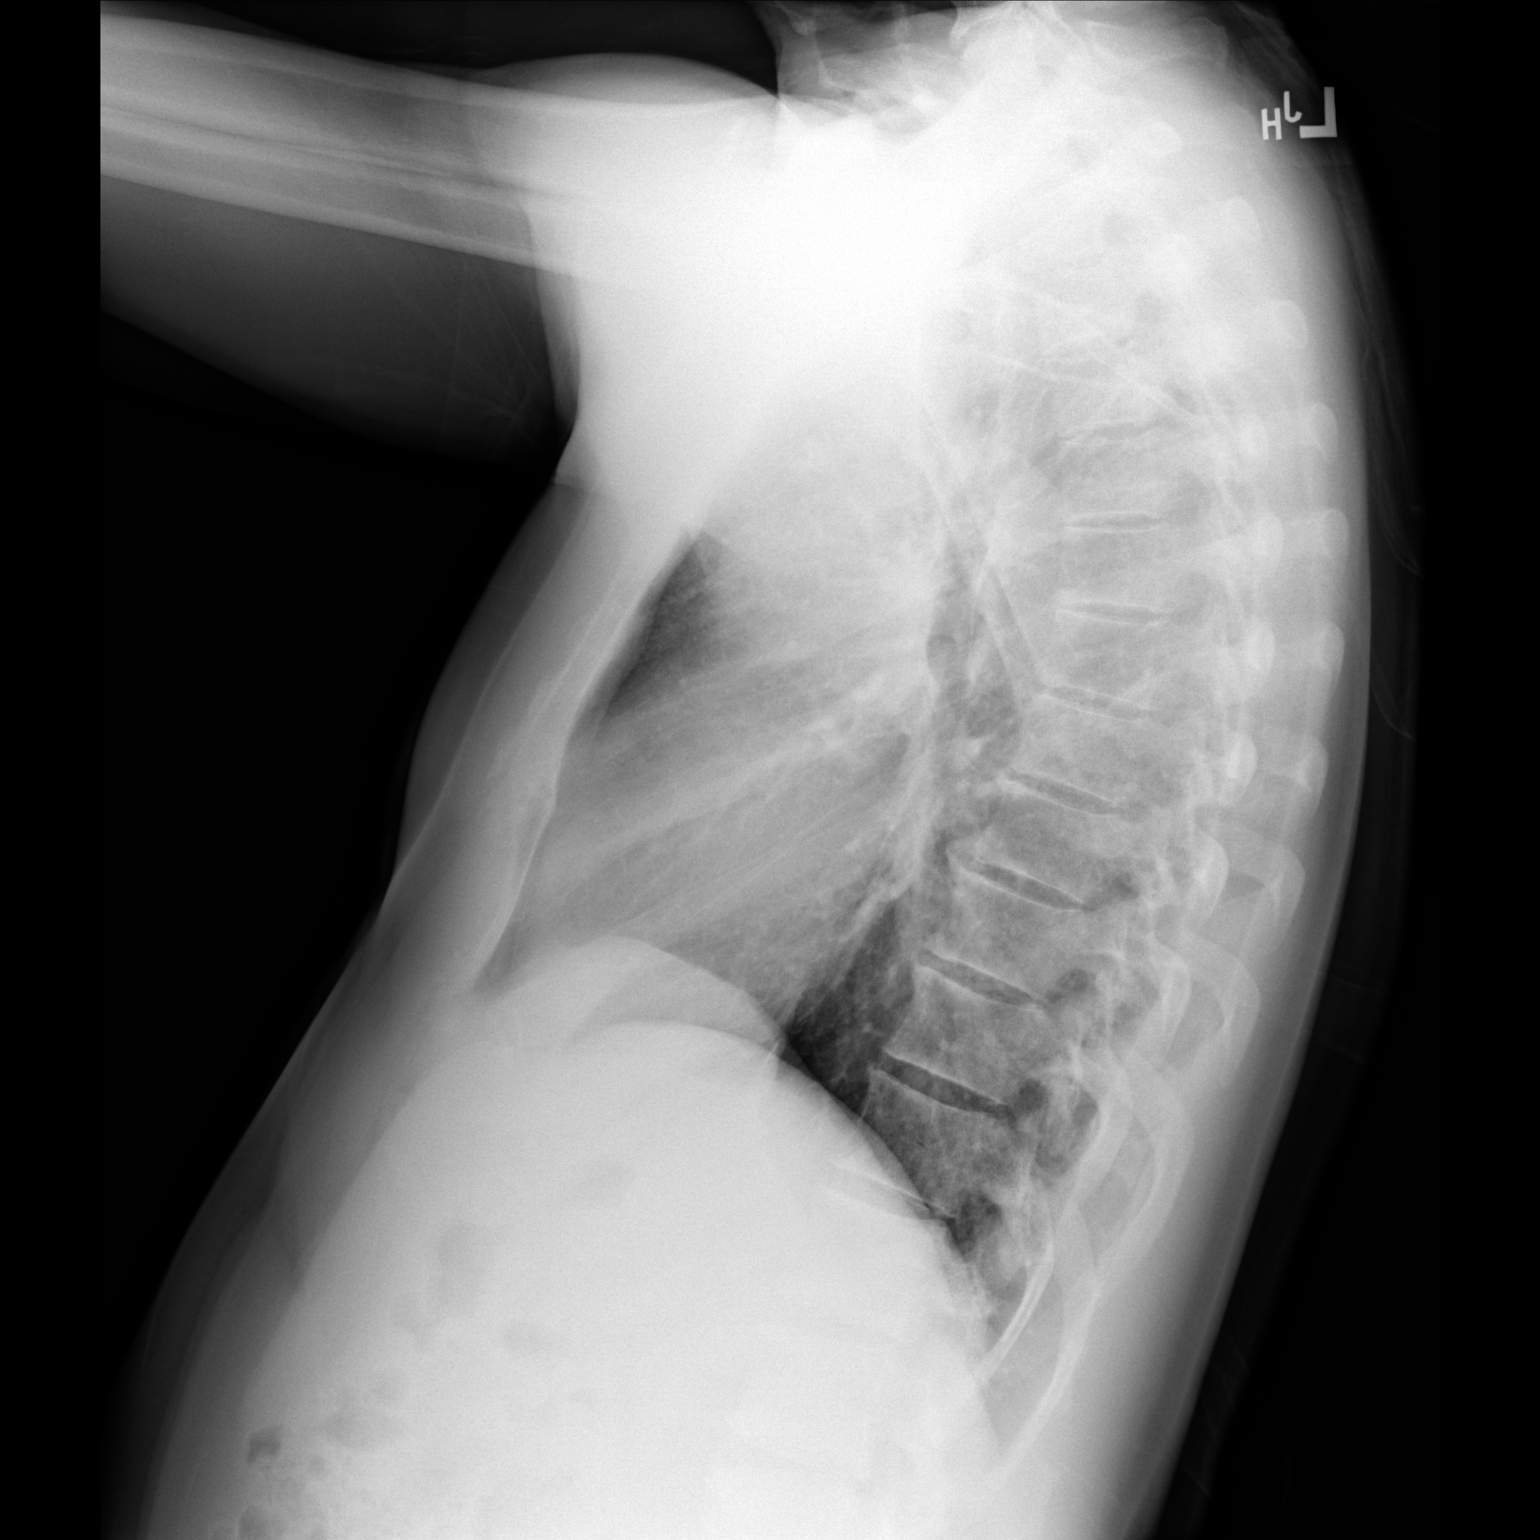

[2 of 2 positions shown; findings below may reference images not displayed]

FINDINGS: The heart size and mediastinal contours are within normal limits.
Both lungs are clear. The visualized skeletal structures are
unremarkable.
IMPRESSION: No acute abnormality of the lungs.
